# Patient Record
Sex: Male | Born: 1935 | Race: White | Hispanic: No | Marital: Married
Health system: Southern US, Community
[De-identification: ages and names within clinical notes are randomized; demographics above are authoritative.]

---

## 2003-02-28 ENCOUNTER — Other Ambulatory Visit: Payer: Self-pay

## 2003-05-11 ENCOUNTER — Other Ambulatory Visit: Payer: Self-pay

## 2003-05-29 ENCOUNTER — Other Ambulatory Visit: Payer: Self-pay

## 2004-01-19 ENCOUNTER — Ambulatory Visit (HOSPITAL_COMMUNITY): Admission: RE | Admit: 2004-01-19 | Discharge: 2004-01-20 | Payer: Self-pay | Admitting: Otolaryngology

## 2004-06-28 ENCOUNTER — Ambulatory Visit: Payer: Self-pay | Admitting: Internal Medicine

## 2004-11-17 ENCOUNTER — Ambulatory Visit: Payer: Self-pay | Admitting: Vascular Surgery

## 2004-11-24 ENCOUNTER — Ambulatory Visit: Payer: Self-pay | Admitting: Vascular Surgery

## 2005-08-15 ENCOUNTER — Ambulatory Visit: Payer: Self-pay | Admitting: Gastroenterology

## 2005-10-02 ENCOUNTER — Ambulatory Visit: Payer: Self-pay | Admitting: *Deleted

## 2005-10-11 ENCOUNTER — Observation Stay (HOSPITAL_COMMUNITY): Admission: RE | Admit: 2005-10-11 | Discharge: 2005-10-12 | Payer: Self-pay | Admitting: *Deleted

## 2005-10-17 ENCOUNTER — Ambulatory Visit: Payer: Self-pay | Admitting: *Deleted

## 2006-11-27 ENCOUNTER — Ambulatory Visit: Payer: Self-pay | Admitting: Internal Medicine

## 2007-11-25 ENCOUNTER — Ambulatory Visit: Payer: Self-pay | Admitting: Internal Medicine

## 2008-08-05 ENCOUNTER — Ambulatory Visit: Payer: Self-pay | Admitting: Nephrology

## 2009-08-04 ENCOUNTER — Ambulatory Visit: Payer: Self-pay | Admitting: Nephrology

## 2010-06-11 ENCOUNTER — Observation Stay: Payer: Self-pay | Admitting: Internal Medicine

## 2010-06-30 ENCOUNTER — Ambulatory Visit: Payer: Self-pay | Admitting: Internal Medicine

## 2010-10-01 ENCOUNTER — Ambulatory Visit: Payer: Self-pay | Admitting: Internal Medicine

## 2011-07-12 ENCOUNTER — Other Ambulatory Visit: Payer: Self-pay

## 2011-07-12 LAB — BODY FLUID CELL COUNT WITH DIFFERENTIAL
Basophil: 0 %
Eosinophil: 0 %
Lymphocytes: 0 %
Neutrophils: 99 %
Nucleated Cell Count: 53933 /mm3
Other Cells BF: 0 %
Other Mononuclear Cells: 1 %

## 2011-07-12 LAB — SYNOVIAL FLUID, CRYSTAL

## 2011-07-16 LAB — BODY FLUID CULTURE

## 2011-07-17 ENCOUNTER — Other Ambulatory Visit: Payer: Self-pay

## 2011-07-17 LAB — SYNOVIAL CELL COUNT + DIFF, W/ CRYSTALS
Basophil: 0 %
Eosinophil: 0 %
Lymphocytes: 0 %
Neutrophils: 100 %
Nucleated Cell Count: 29887 /mm3
Other Cells BF: 0 %
Other Mononuclear Cells: 0 %

## 2011-07-18 ENCOUNTER — Inpatient Hospital Stay: Payer: Self-pay | Admitting: Orthopedic Surgery

## 2011-07-18 LAB — URINALYSIS, COMPLETE
Bilirubin,UR: NEGATIVE
Blood: NEGATIVE
Glucose,UR: NEGATIVE mg/dL (ref 0–75)
Leukocyte Esterase: NEGATIVE
Nitrite: NEGATIVE
Ph: 5 (ref 4.5–8.0)
Protein: NEGATIVE
RBC,UR: 1 /HPF (ref 0–5)
Specific Gravity: 1.024 (ref 1.003–1.030)
Squamous Epithelial: NONE SEEN
WBC UR: 1 /HPF (ref 0–5)

## 2011-07-19 LAB — CBC WITH DIFFERENTIAL/PLATELET
Basophil #: 0 10*3/uL (ref 0.0–0.1)
Basophil %: 0.3 %
Eosinophil #: 0 10*3/uL (ref 0.0–0.7)
Eosinophil %: 0.3 %
HCT: 32.8 % — ABNORMAL LOW (ref 40.0–52.0)
HGB: 11 g/dL — ABNORMAL LOW (ref 13.0–18.0)
Lymphocyte #: 0.6 10*3/uL — ABNORMAL LOW (ref 1.0–3.6)
Lymphocyte %: 9.2 %
MCH: 29.6 pg (ref 26.0–34.0)
MCHC: 33.6 g/dL (ref 32.0–36.0)
MCV: 88 fL (ref 80–100)
Monocyte #: 0.2 x10 3/mm (ref 0.2–1.0)
Monocyte %: 3.9 %
Neutrophil #: 5.4 10*3/uL (ref 1.4–6.5)
Neutrophil %: 86.3 %
Platelet: 270 10*3/uL (ref 150–440)
RBC: 3.71 10*6/uL — ABNORMAL LOW (ref 4.40–5.90)
RDW: 13.2 % (ref 11.5–14.5)
WBC: 6.2 10*3/uL (ref 3.8–10.6)

## 2011-07-19 LAB — BASIC METABOLIC PANEL
Anion Gap: 11 (ref 7–16)
BUN: 57 mg/dL — ABNORMAL HIGH (ref 7–18)
Calcium, Total: 8.7 mg/dL (ref 8.5–10.1)
Chloride: 104 mmol/L (ref 98–107)
Co2: 24 mmol/L (ref 21–32)
Creatinine: 2.97 mg/dL — ABNORMAL HIGH (ref 0.60–1.30)
EGFR (African American): 21 — ABNORMAL LOW
EGFR (Non-African Amer.): 18 — ABNORMAL LOW
Glucose: 85 mg/dL (ref 65–99)
Osmolality: 293 (ref 275–301)
Potassium: 3.1 mmol/L — ABNORMAL LOW (ref 3.5–5.1)
Sodium: 139 mmol/L (ref 136–145)

## 2011-07-20 LAB — BASIC METABOLIC PANEL
Anion Gap: 10 (ref 7–16)
BUN: 46 mg/dL — ABNORMAL HIGH (ref 7–18)
Calcium, Total: 8.2 mg/dL — ABNORMAL LOW (ref 8.5–10.1)
Chloride: 107 mmol/L (ref 98–107)
Co2: 25 mmol/L (ref 21–32)
Creatinine: 2.87 mg/dL — ABNORMAL HIGH (ref 0.60–1.30)
EGFR (African American): 22 — ABNORMAL LOW
EGFR (Non-African Amer.): 19 — ABNORMAL LOW
Glucose: 83 mg/dL (ref 65–99)
Osmolality: 294 (ref 275–301)
Potassium: 3.4 mmol/L — ABNORMAL LOW (ref 3.5–5.1)
Sodium: 142 mmol/L (ref 136–145)

## 2011-07-20 LAB — CBC WITH DIFFERENTIAL/PLATELET
Basophil #: 0 10*3/uL (ref 0.0–0.1)
Basophil %: 0.3 %
Eosinophil #: 0 10*3/uL (ref 0.0–0.7)
Eosinophil %: 0.7 %
HCT: 30.2 % — ABNORMAL LOW (ref 40.0–52.0)
HGB: 10.1 g/dL — ABNORMAL LOW (ref 13.0–18.0)
Lymphocyte #: 0.9 10*3/uL — ABNORMAL LOW (ref 1.0–3.6)
Lymphocyte %: 14.1 %
MCH: 29.6 pg (ref 26.0–34.0)
MCHC: 33.4 g/dL (ref 32.0–36.0)
MCV: 89 fL (ref 80–100)
Monocyte #: 0.6 x10 3/mm (ref 0.2–1.0)
Monocyte %: 9.1 %
Neutrophil #: 4.9 10*3/uL (ref 1.4–6.5)
Neutrophil %: 75.8 %
Platelet: 253 10*3/uL (ref 150–440)
RBC: 3.41 10*6/uL — ABNORMAL LOW (ref 4.40–5.90)
RDW: 12.8 % (ref 11.5–14.5)
WBC: 6.4 10*3/uL (ref 3.8–10.6)

## 2011-07-21 ENCOUNTER — Encounter: Payer: Self-pay | Admitting: Internal Medicine

## 2011-07-21 LAB — CBC WITH DIFFERENTIAL/PLATELET
Basophil #: 0 10*3/uL (ref 0.0–0.1)
Basophil %: 0.3 %
Eosinophil #: 0.1 10*3/uL (ref 0.0–0.7)
Eosinophil %: 1.2 %
HCT: 28.5 % — ABNORMAL LOW (ref 40.0–52.0)
HGB: 9.6 g/dL — ABNORMAL LOW (ref 13.0–18.0)
Lymphocyte #: 1.2 10*3/uL (ref 1.0–3.6)
Lymphocyte %: 16.5 %
MCH: 30.2 pg (ref 26.0–34.0)
MCHC: 33.7 g/dL (ref 32.0–36.0)
MCV: 89 fL (ref 80–100)
Monocyte #: 0.7 x10 3/mm (ref 0.2–1.0)
Monocyte %: 9.7 %
Neutrophil #: 5.4 10*3/uL (ref 1.4–6.5)
Neutrophil %: 72.3 %
Platelet: 244 10*3/uL (ref 150–440)
RBC: 3.18 10*6/uL — ABNORMAL LOW (ref 4.40–5.90)
RDW: 13.1 % (ref 11.5–14.5)
WBC: 7.5 10*3/uL (ref 3.8–10.6)

## 2011-07-21 LAB — BASIC METABOLIC PANEL
Anion Gap: 11 (ref 7–16)
BUN: 40 mg/dL — ABNORMAL HIGH (ref 7–18)
Calcium, Total: 8 mg/dL — ABNORMAL LOW (ref 8.5–10.1)
Chloride: 108 mmol/L — ABNORMAL HIGH (ref 98–107)
Co2: 22 mmol/L (ref 21–32)
Creatinine: 2.71 mg/dL — ABNORMAL HIGH (ref 0.60–1.30)
EGFR (African American): 23 — ABNORMAL LOW
EGFR (Non-African Amer.): 20 — ABNORMAL LOW
Glucose: 85 mg/dL (ref 65–99)
Osmolality: 290 (ref 275–301)
Potassium: 4.1 mmol/L (ref 3.5–5.1)
Sodium: 141 mmol/L (ref 136–145)

## 2011-07-21 LAB — SYNOVIAL CELL COUNT + DIFF, W/ CRYSTALS
Basophil: 0 %
Eosinophil: 0 %
Lymphocytes: 2 %
Neutrophils: 97 %
Nucleated Cell Count: 3464 /mm3
Other Cells BF: 0 %
Other Mononuclear Cells: 1 %

## 2011-07-21 LAB — BODY FLUID CULTURE

## 2011-07-24 LAB — CULTURE, BLOOD (SINGLE)

## 2011-07-26 LAB — BODY FLUID CULTURE

## 2011-07-27 LAB — BASIC METABOLIC PANEL
Anion Gap: 7 (ref 7–16)
BUN: 38 mg/dL — ABNORMAL HIGH (ref 7–18)
Calcium, Total: 8.8 mg/dL (ref 8.5–10.1)
Chloride: 93 mmol/L — ABNORMAL LOW (ref 98–107)
Co2: 28 mmol/L (ref 21–32)
Creatinine: 2.7 mg/dL — ABNORMAL HIGH (ref 0.60–1.30)
EGFR (African American): 23 — ABNORMAL LOW
EGFR (Non-African Amer.): 20 — ABNORMAL LOW
Glucose: 90 mg/dL (ref 65–99)
Osmolality: 266 (ref 275–301)
Potassium: 4.9 mmol/L (ref 3.5–5.1)
Sodium: 128 mmol/L — ABNORMAL LOW (ref 136–145)

## 2011-08-01 LAB — BASIC METABOLIC PANEL
Anion Gap: 10 (ref 7–16)
BUN: 39 mg/dL — ABNORMAL HIGH (ref 7–18)
Calcium, Total: 7.9 mg/dL — ABNORMAL LOW (ref 8.5–10.1)
Chloride: 85 mmol/L — ABNORMAL LOW (ref 98–107)
Co2: 26 mmol/L (ref 21–32)
Creatinine: 2.21 mg/dL — ABNORMAL HIGH (ref 0.60–1.30)
EGFR (African American): 30 — ABNORMAL LOW
EGFR (Non-African Amer.): 26 — ABNORMAL LOW
Glucose: 70 mg/dL (ref 65–99)
Osmolality: 252 (ref 275–301)
Potassium: 3.6 mmol/L (ref 3.5–5.1)
Sodium: 121 mmol/L — ABNORMAL LOW (ref 136–145)

## 2011-08-07 LAB — URINALYSIS, COMPLETE
Bacteria: NONE SEEN
Bilirubin,UR: NEGATIVE
Blood: NEGATIVE
Glucose,UR: NEGATIVE mg/dL (ref 0–75)
Leukocyte Esterase: NEGATIVE
Nitrite: NEGATIVE
Ph: 6 (ref 4.5–8.0)
Protein: 100
RBC,UR: 2 /HPF (ref 0–5)
Specific Gravity: 1.019 (ref 1.003–1.030)
Squamous Epithelial: <1
WBC UR: 1 /HPF (ref 0–5)

## 2011-08-08 ENCOUNTER — Inpatient Hospital Stay: Payer: Self-pay | Admitting: Internal Medicine

## 2011-08-08 LAB — CBC
HCT: 32.3 % — ABNORMAL LOW (ref 40.0–52.0)
HGB: 11.2 g/dL — ABNORMAL LOW (ref 13.0–18.0)
MCH: 29.2 pg (ref 26.0–34.0)
MCHC: 34.6 g/dL (ref 32.0–36.0)
MCV: 84 fL (ref 80–100)
Platelet: 276 10*3/uL (ref 150–440)
RBC: 3.84 10*6/uL — ABNORMAL LOW (ref 4.40–5.90)
RDW: 13.2 % (ref 11.5–14.5)
WBC: 10.9 10*3/uL — ABNORMAL HIGH (ref 3.8–10.6)

## 2011-08-08 LAB — OSMOLALITY, URINE: Osmolality: 538 mOsm/kg

## 2011-08-08 LAB — BASIC METABOLIC PANEL
Anion Gap: 9 (ref 7–16)
BUN: 32 mg/dL — ABNORMAL HIGH (ref 7–18)
Calcium, Total: 8.1 mg/dL — ABNORMAL LOW (ref 8.5–10.1)
Chloride: 82 mmol/L — ABNORMAL LOW (ref 98–107)
Co2: 25 mmol/L (ref 21–32)
Creatinine: 1.99 mg/dL — ABNORMAL HIGH (ref 0.60–1.30)
EGFR (African American): 34 — ABNORMAL LOW
EGFR (Non-African Amer.): 29 — ABNORMAL LOW
Glucose: 77 mg/dL (ref 65–99)
Osmolality: 240 (ref 275–301)
Potassium: 3.4 mmol/L — ABNORMAL LOW (ref 3.5–5.1)
Sodium: 116 mmol/L — CL (ref 136–145)

## 2011-08-08 LAB — URINALYSIS, COMPLETE
Bacteria: NONE SEEN
Bilirubin,UR: NEGATIVE
Blood: NEGATIVE
Glucose,UR: NEGATIVE mg/dL (ref 0–75)
Ketone: NEGATIVE
Leukocyte Esterase: NEGATIVE
Nitrite: NEGATIVE
Ph: 7 (ref 4.5–8.0)
Protein: 30
RBC,UR: 13 /HPF (ref 0–5)
Specific Gravity: 1.016 (ref 1.003–1.030)
Squamous Epithelial: NONE SEEN
WBC UR: <1 /HPF (ref 0–5)

## 2011-08-08 LAB — OSMOLALITY: Osmolality: 247 mOsm/kg — CL (ref 280–301)

## 2011-08-08 LAB — TROPONIN I: Troponin-I: 0.08 ng/mL — ABNORMAL HIGH

## 2011-08-08 LAB — URIC ACID: Uric Acid: 5.8 mg/dL (ref 3.5–7.2)

## 2011-08-09 LAB — CK TOTAL AND CKMB (NOT AT ARMC)
CK, Total: 70 U/L (ref 35–232)
CK, Total: 74 U/L (ref 35–232)
CK-MB: 2.7 ng/mL (ref 0.5–3.6)
CK-MB: 2.4 ng/mL (ref 0.5–3.6)

## 2011-08-09 LAB — BASIC METABOLIC PANEL
Anion Gap: 8 (ref 7–16)
BUN: 31 mg/dL — ABNORMAL HIGH (ref 7–18)
Calcium, Total: 7.2 mg/dL — ABNORMAL LOW (ref 8.5–10.1)
Chloride: 88 mmol/L — ABNORMAL LOW (ref 98–107)
Co2: 25 mmol/L (ref 21–32)
Creatinine: 1.93 mg/dL — ABNORMAL HIGH (ref 0.60–1.30)
EGFR (African American): 35 — ABNORMAL LOW
EGFR (Non-African Amer.): 30 — ABNORMAL LOW
Glucose: 77 mg/dL (ref 65–99)
Osmolality: 249 (ref 275–301)
Potassium: 3.6 mmol/L (ref 3.5–5.1)
Sodium: 121 mmol/L — ABNORMAL LOW (ref 136–145)

## 2011-08-09 LAB — CBC WITH DIFFERENTIAL/PLATELET
Basophil #: 0 10*3/uL (ref 0.0–0.1)
Basophil %: 0 %
Eosinophil #: 0 10*3/uL (ref 0.0–0.7)
Eosinophil %: 0 %
HCT: 29.4 % — ABNORMAL LOW (ref 40.0–52.0)
HGB: 10 g/dL — ABNORMAL LOW (ref 13.0–18.0)
Lymphocyte #: 0.9 10*3/uL — ABNORMAL LOW (ref 1.0–3.6)
Lymphocyte %: 10.7 %
MCH: 28.9 pg (ref 26.0–34.0)
MCHC: 34.1 g/dL (ref 32.0–36.0)
MCV: 85 fL (ref 80–100)
Monocyte #: 0.8 x10 3/mm (ref 0.2–1.0)
Monocyte %: 9.2 %
Neutrophil #: 6.6 10*3/uL — ABNORMAL HIGH (ref 1.4–6.5)
Neutrophil %: 80.1 %
Platelet: 204 10*3/uL (ref 150–440)
RBC: 3.46 10*6/uL — ABNORMAL LOW (ref 4.40–5.90)
RDW: 12.9 % (ref 11.5–14.5)
WBC: 8.3 10*3/uL (ref 3.8–10.6)

## 2011-08-09 LAB — TROPONIN I
Troponin-I: 0.08 ng/mL — ABNORMAL HIGH
Troponin-I: 0.07 ng/mL — ABNORMAL HIGH

## 2011-08-10 LAB — SYNOVIAL CELL COUNT + DIFF, W/ CRYSTALS
Basophil: 0 %
Eosinophil: 0 %
Lymphocytes: 0 %
Neutrophils: 95 %
Nucleated Cell Count: 30974 /mm3
Other Cells BF: 0 %
Other Mononuclear Cells: 5 %

## 2011-08-10 LAB — BASIC METABOLIC PANEL
Anion Gap: 11 (ref 7–16)
BUN: 23 mg/dL — ABNORMAL HIGH (ref 7–18)
Calcium, Total: 7 mg/dL — CL (ref 8.5–10.1)
Chloride: 92 mmol/L — ABNORMAL LOW (ref 98–107)
Co2: 16 mmol/L — ABNORMAL LOW (ref 21–32)
Creatinine: 1.59 mg/dL — ABNORMAL HIGH (ref 0.60–1.30)
EGFR (African American): 44 — ABNORMAL LOW
EGFR (Non-African Amer.): 38 — ABNORMAL LOW
Glucose: 72 mg/dL (ref 65–99)
Osmolality: 243 (ref 275–301)
Potassium: 3.6 mmol/L (ref 3.5–5.1)
Sodium: 119 mmol/L — CL (ref 136–145)

## 2011-08-11 LAB — BASIC METABOLIC PANEL
Anion Gap: 10 (ref 7–16)
BUN: 21 mg/dL — ABNORMAL HIGH (ref 7–18)
Calcium, Total: 7.3 mg/dL — ABNORMAL LOW (ref 8.5–10.1)
Chloride: 91 mmol/L — ABNORMAL LOW (ref 98–107)
Co2: 20 mmol/L — ABNORMAL LOW (ref 21–32)
Creatinine: 1.82 mg/dL — ABNORMAL HIGH (ref 0.60–1.30)
EGFR (African American): 37 — ABNORMAL LOW
EGFR (Non-African Amer.): 32 — ABNORMAL LOW
Glucose: 65 mg/dL (ref 65–99)
Osmolality: 245 (ref 275–301)
Potassium: 3.6 mmol/L (ref 3.5–5.1)
Sodium: 121 mmol/L — ABNORMAL LOW (ref 136–145)

## 2011-08-11 LAB — SODIUM, URINE, TIMED
Collection Hours: 24 hours
Sodium, 24 Hour Urine: 203 mmol/24HR (ref 40–220)

## 2011-08-11 LAB — CHLORIDE, URINE, TIMED: Chloride, 24 Hour Urine: 227 mmol/24HR (ref 110–250)

## 2011-08-12 LAB — URIC ACID: Uric Acid: 5.2 mg/dL (ref 3.5–7.2)

## 2011-08-12 LAB — BASIC METABOLIC PANEL
Anion Gap: 13 (ref 7–16)
Co2: 19 mmol/L — ABNORMAL LOW (ref 21–32)
Creatinine: 1.8 mg/dL — ABNORMAL HIGH (ref 0.60–1.30)
Glucose: 72 mg/dL (ref 65–99)
Sodium: 124 mmol/L — ABNORMAL LOW (ref 136–145)

## 2011-08-13 LAB — BASIC METABOLIC PANEL
Anion Gap: 9 (ref 7–16)
BUN: 19 mg/dL — ABNORMAL HIGH (ref 7–18)
EGFR (African American): 30 — ABNORMAL LOW
EGFR (Non-African Amer.): 26 — ABNORMAL LOW
Glucose: 72 mg/dL (ref 65–99)
Potassium: 3.6 mmol/L (ref 3.5–5.1)
Sodium: 129 mmol/L — ABNORMAL LOW (ref 136–145)

## 2011-08-13 LAB — PLATELET COUNT: Platelet: 170 10*3/uL (ref 150–440)

## 2011-08-13 LAB — MAGNESIUM: Magnesium: 1 mg/dL — ABNORMAL LOW

## 2011-08-14 LAB — BASIC METABOLIC PANEL
Anion Gap: 8 (ref 7–16)
BUN: 17 mg/dL (ref 7–18)
Calcium, Total: 7.6 mg/dL — ABNORMAL LOW (ref 8.5–10.1)
Chloride: 103 mmol/L (ref 98–107)
Co2: 23 mmol/L (ref 21–32)
Creatinine: 2.08 mg/dL — ABNORMAL HIGH (ref 0.60–1.30)
EGFR (African American): 32 — ABNORMAL LOW
EGFR (Non-African Amer.): 27 — ABNORMAL LOW
Glucose: 79 mg/dL (ref 65–99)
Osmolality: 269 (ref 275–301)
Potassium: 3.6 mmol/L (ref 3.5–5.1)
Sodium: 134 mmol/L — ABNORMAL LOW (ref 136–145)

## 2011-08-14 LAB — MAGNESIUM: Magnesium: 1.4 mg/dL — ABNORMAL LOW

## 2011-08-14 LAB — BODY FLUID CULTURE

## 2011-08-15 LAB — CBC WITH DIFFERENTIAL/PLATELET
Basophil #: 0 10*3/uL (ref 0.0–0.1)
Basophil %: 0.8 %
Eosinophil #: 0.1 10*3/uL (ref 0.0–0.7)
Eosinophil %: 1.8 %
HCT: 29.7 % — ABNORMAL LOW (ref 40.0–52.0)
HGB: 10 g/dL — ABNORMAL LOW (ref 13.0–18.0)
Lymphocyte #: 0.9 10*3/uL — ABNORMAL LOW (ref 1.0–3.6)
Lymphocyte %: 21.9 %
MCH: 29.2 pg (ref 26.0–34.0)
MCHC: 33.6 g/dL (ref 32.0–36.0)
MCV: 87 fL (ref 80–100)
Monocyte #: 0.4 x10 3/mm (ref 0.2–1.0)
Monocyte %: 10.7 %
Neutrophil #: 2.6 10*3/uL (ref 1.4–6.5)
Neutrophil %: 64.8 %
Platelet: 136 10*3/uL — ABNORMAL LOW (ref 150–440)
RBC: 3.4 10*6/uL — ABNORMAL LOW (ref 4.40–5.90)
RDW: 13.9 % (ref 11.5–14.5)
WBC: 4 10*3/uL (ref 3.8–10.6)

## 2011-08-15 LAB — MAGNESIUM: Magnesium: 1.9 mg/dL

## 2011-08-15 LAB — BASIC METABOLIC PANEL
Anion Gap: 8 (ref 7–16)
BUN: 17 mg/dL (ref 7–18)
Calcium, Total: 7.5 mg/dL — ABNORMAL LOW (ref 8.5–10.1)
Chloride: 104 mmol/L (ref 98–107)
Co2: 23 mmol/L (ref 21–32)
Creatinine: 1.94 mg/dL — ABNORMAL HIGH (ref 0.60–1.30)
EGFR (African American): 35 — ABNORMAL LOW
EGFR (Non-African Amer.): 30 — ABNORMAL LOW
Glucose: 89 mg/dL (ref 65–99)
Osmolality: 271 (ref 275–301)
Potassium: 3.6 mmol/L (ref 3.5–5.1)
Sodium: 135 mmol/L — ABNORMAL LOW (ref 136–145)

## 2011-08-16 ENCOUNTER — Encounter: Payer: Self-pay | Admitting: Internal Medicine

## 2011-08-22 LAB — CBC WITH DIFFERENTIAL/PLATELET
Basophil #: 0 10*3/uL (ref 0.0–0.1)
Basophil %: 0.5 %
Eosinophil #: 0.1 10*3/uL (ref 0.0–0.7)
Eosinophil %: 1.1 %
HCT: 29.4 % — ABNORMAL LOW (ref 40.0–52.0)
HGB: 9.7 g/dL — ABNORMAL LOW (ref 13.0–18.0)
Lymphocyte #: 0.9 10*3/uL — ABNORMAL LOW (ref 1.0–3.6)
Lymphocyte %: 18.1 %
MCH: 29.2 pg (ref 26.0–34.0)
MCHC: 32.9 g/dL (ref 32.0–36.0)
MCV: 89 fL (ref 80–100)
Monocyte #: 0.5 x10 3/mm (ref 0.2–1.0)
Monocyte %: 8.8 %
Neutrophil #: 3.8 10*3/uL (ref 1.4–6.5)
Neutrophil %: 71.5 %
Platelet: 265 10*3/uL (ref 150–440)
RBC: 3.3 10*6/uL — ABNORMAL LOW (ref 4.40–5.90)
RDW: 14 % (ref 11.5–14.5)
WBC: 5.2 10*3/uL (ref 3.8–10.6)

## 2011-08-22 LAB — BASIC METABOLIC PANEL
Anion Gap: 8 (ref 7–16)
BUN: 23 mg/dL — ABNORMAL HIGH (ref 7–18)
Calcium, Total: 8.4 mg/dL — ABNORMAL LOW (ref 8.5–10.1)
Chloride: 103 mmol/L (ref 98–107)
Co2: 24 mmol/L (ref 21–32)
Creatinine: 2.14 mg/dL — ABNORMAL HIGH (ref 0.60–1.30)
EGFR (African American): 31 — ABNORMAL LOW
EGFR (Non-African Amer.): 26 — ABNORMAL LOW
Glucose: 84 mg/dL (ref 65–99)
Osmolality: 273 (ref 275–301)
Potassium: 5.8 mmol/L — ABNORMAL HIGH (ref 3.5–5.1)
Sodium: 135 mmol/L — ABNORMAL LOW (ref 136–145)

## 2011-08-22 LAB — MAGNESIUM: Magnesium: 1.3 mg/dL — ABNORMAL LOW

## 2011-08-28 ENCOUNTER — Ambulatory Visit: Payer: Self-pay | Admitting: Orthopedic Surgery

## 2011-08-28 LAB — CBC WITH DIFFERENTIAL/PLATELET
Basophil #: 0 10*3/uL (ref 0.0–0.1)
Basophil %: 0.3 %
Eosinophil #: 0 10*3/uL (ref 0.0–0.7)
Eosinophil %: 0.4 %
HCT: 29.5 % — ABNORMAL LOW (ref 40.0–52.0)
HGB: 9.7 g/dL — ABNORMAL LOW (ref 13.0–18.0)
Lymphocyte #: 1.2 10*3/uL (ref 1.0–3.6)
Lymphocyte %: 13.8 %
MCH: 29.1 pg (ref 26.0–34.0)
MCHC: 32.9 g/dL (ref 32.0–36.0)
MCV: 88 fL (ref 80–100)
Monocyte #: 0.7 x10 3/mm (ref 0.2–1.0)
Monocyte %: 8.6 %
Neutrophil #: 6.6 10*3/uL — ABNORMAL HIGH (ref 1.4–6.5)
Neutrophil %: 76.9 %
Platelet: 353 10*3/uL (ref 150–440)
RBC: 3.33 10*6/uL — ABNORMAL LOW (ref 4.40–5.90)
RDW: 14.4 % (ref 11.5–14.5)
WBC: 8.6 10*3/uL (ref 3.8–10.6)

## 2011-08-28 LAB — BASIC METABOLIC PANEL
Anion Gap: 8 (ref 7–16)
BUN: 24 mg/dL — ABNORMAL HIGH (ref 7–18)
Calcium, Total: 8.4 mg/dL — ABNORMAL LOW (ref 8.5–10.1)
Chloride: 101 mmol/L (ref 98–107)
Co2: 25 mmol/L (ref 21–32)
Creatinine: 2.43 mg/dL — ABNORMAL HIGH (ref 0.60–1.30)
EGFR (African American): 26 — ABNORMAL LOW
EGFR (Non-African Amer.): 23 — ABNORMAL LOW
Glucose: 87 mg/dL (ref 65–99)
Osmolality: 272 (ref 275–301)
Potassium: 4.2 mmol/L (ref 3.5–5.1)
Sodium: 134 mmol/L — ABNORMAL LOW (ref 136–145)

## 2011-08-28 LAB — MAGNESIUM: Magnesium: 1.6 mg/dL — ABNORMAL LOW

## 2011-08-31 ENCOUNTER — Ambulatory Visit: Payer: Self-pay | Admitting: Orthopedic Surgery

## 2011-09-05 ENCOUNTER — Emergency Department: Payer: Self-pay | Admitting: Emergency Medicine

## 2011-09-25 ENCOUNTER — Ambulatory Visit: Payer: Self-pay | Admitting: Orthopedic Surgery

## 2012-03-19 DIAGNOSIS — I2510 Atherosclerotic heart disease of native coronary artery without angina pectoris: Secondary | ICD-10-CM

## 2012-03-19 DIAGNOSIS — I609 Nontraumatic subarachnoid hemorrhage, unspecified: Secondary | ICD-10-CM

## 2012-03-19 DIAGNOSIS — S02109A Fracture of base of skull, unspecified side, initial encounter for closed fracture: Secondary | ICD-10-CM

## 2012-03-19 DIAGNOSIS — I6200 Nontraumatic subdural hemorrhage, unspecified: Secondary | ICD-10-CM

## 2012-04-12 ENCOUNTER — Encounter: Payer: Self-pay | Admitting: Nurse Practitioner

## 2012-04-12 ENCOUNTER — Inpatient Hospital Stay: Payer: Self-pay | Admitting: Internal Medicine

## 2012-04-12 ENCOUNTER — Encounter: Payer: Self-pay | Admitting: Cardiothoracic Surgery

## 2012-04-12 LAB — BASIC METABOLIC PANEL
Anion Gap: 6 — ABNORMAL LOW (ref 7–16)
Calcium, Total: 8.8 mg/dL (ref 8.5–10.1)
Chloride: 110 mmol/L — ABNORMAL HIGH (ref 98–107)
Co2: 23 mmol/L (ref 21–32)
EGFR (Non-African Amer.): 18 — ABNORMAL LOW
Glucose: 113 mg/dL — ABNORMAL HIGH (ref 65–99)
Osmolality: 288 (ref 275–301)
Potassium: 5.2 mmol/L — ABNORMAL HIGH (ref 3.5–5.1)
Sodium: 139 mmol/L (ref 136–145)

## 2012-04-12 LAB — CBC WITH DIFFERENTIAL/PLATELET
Basophil #: 0 10*3/uL (ref 0.0–0.1)
Basophil %: 0.2 %
Eosinophil %: 0.7 %
HCT: 30.9 % — ABNORMAL LOW (ref 40.0–52.0)
HGB: 9.7 g/dL — ABNORMAL LOW (ref 13.0–18.0)
Lymphocyte #: 1.4 10*3/uL (ref 1.0–3.6)
Lymphocyte %: 10.4 %
Monocyte #: 0.8 x10 3/mm (ref 0.2–1.0)
Monocyte %: 6.1 %
Neutrophil %: 82.6 %
Platelet: 293 10*3/uL (ref 150–440)
WBC: 13.3 10*3/uL — ABNORMAL HIGH (ref 3.8–10.6)

## 2012-04-12 LAB — URINALYSIS, COMPLETE
Bilirubin,UR: NEGATIVE
Blood: NEGATIVE
Leukocyte Esterase: NEGATIVE
Nitrite: NEGATIVE
Protein: NEGATIVE
Specific Gravity: 1.013 (ref 1.003–1.030)

## 2012-04-12 LAB — PROTIME-INR
INR: 1
Prothrombin Time: 13.7 secs (ref 11.5–14.7)

## 2012-04-13 ENCOUNTER — Encounter: Payer: Self-pay | Admitting: Nurse Practitioner

## 2012-04-13 ENCOUNTER — Ambulatory Visit: Payer: Self-pay | Admitting: Internal Medicine

## 2012-04-13 LAB — CBC WITH DIFFERENTIAL/PLATELET
Eosinophil #: 0.1 10*3/uL (ref 0.0–0.7)
Eosinophil %: 1.3 %
HCT: 28.8 % — ABNORMAL LOW (ref 40.0–52.0)
HGB: 9.4 g/dL — ABNORMAL LOW (ref 13.0–18.0)
Lymphocyte %: 8 %
MCH: 29.2 pg (ref 26.0–34.0)
MCHC: 32.8 g/dL (ref 32.0–36.0)
MCV: 89 fL (ref 80–100)
Monocyte #: 0.7 x10 3/mm (ref 0.2–1.0)
Neutrophil #: 9.7 10*3/uL — ABNORMAL HIGH (ref 1.4–6.5)
Platelet: 229 10*3/uL (ref 150–440)
RDW: 14.1 % (ref 11.5–14.5)
WBC: 11.4 10*3/uL — ABNORMAL HIGH (ref 3.8–10.6)

## 2012-04-13 LAB — BASIC METABOLIC PANEL
Anion Gap: 7 (ref 7–16)
BUN: 36 mg/dL — ABNORMAL HIGH (ref 7–18)
Chloride: 111 mmol/L — ABNORMAL HIGH (ref 98–107)
Co2: 24 mmol/L (ref 21–32)
EGFR (African American): 24 — ABNORMAL LOW
Osmolality: 290 (ref 275–301)
Potassium: 4.6 mmol/L (ref 3.5–5.1)
Sodium: 142 mmol/L (ref 136–145)

## 2012-04-14 LAB — CBC WITH DIFFERENTIAL/PLATELET
Basophil #: 0.1 10*3/uL (ref 0.0–0.1)
Eosinophil #: 0.1 10*3/uL (ref 0.0–0.7)
HCT: 35 % — ABNORMAL LOW (ref 40.0–52.0)
HGB: 8.2 g/dL — ABNORMAL LOW (ref 13.0–18.0)
Lymphocyte #: 1.1 10*3/uL (ref 1.0–3.6)
Lymphocyte %: 8.8 %
Lymphocyte %: 8.2 %
MCH: 30 pg (ref 26.0–34.0)
MCH: 28.6 pg (ref 26.0–34.0)
MCHC: 33.7 g/dL (ref 32.0–36.0)
MCV: 89 fL (ref 80–100)
MCV: 88 fL (ref 80–100)
Monocyte #: 0.8 x10 3/mm (ref 0.2–1.0)
Monocyte %: 5.9 %
Neutrophil #: 9.2 10*3/uL — ABNORMAL HIGH (ref 1.4–6.5)
Neutrophil #: 11 10*3/uL — ABNORMAL HIGH (ref 1.4–6.5)
Neutrophil %: 84.4 %
Neutrophil %: 84.9 %
RDW: 15.7 % — ABNORMAL HIGH (ref 11.5–14.5)
WBC: 10.9 10*3/uL — ABNORMAL HIGH (ref 3.8–10.6)
WBC: 13 10*3/uL — ABNORMAL HIGH (ref 3.8–10.6)

## 2012-04-14 LAB — BASIC METABOLIC PANEL
Anion Gap: 5 — ABNORMAL LOW (ref 7–16)
BUN: 34 mg/dL — ABNORMAL HIGH (ref 7–18)
Calcium, Total: 8.2 mg/dL — ABNORMAL LOW (ref 8.5–10.1)
Chloride: 110 mmol/L — ABNORMAL HIGH (ref 98–107)
Co2: 25 mmol/L (ref 21–32)
Sodium: 140 mmol/L (ref 136–145)

## 2012-04-15 LAB — URINALYSIS, COMPLETE
Bilirubin,UR: NEGATIVE
Glucose,UR: NEGATIVE mg/dL (ref 0–75)
Ketone: NEGATIVE
Leukocyte Esterase: NEGATIVE
Nitrite: NEGATIVE
Protein: NEGATIVE
Specific Gravity: 1.013 (ref 1.003–1.030)
WBC UR: 1 /HPF (ref 0–5)

## 2012-04-15 LAB — CBC WITH DIFFERENTIAL/PLATELET
Basophil #: 0 10*3/uL (ref 0.0–0.1)
Eosinophil #: 0 10*3/uL (ref 0.0–0.7)
Eosinophil %: 0.2 %
HGB: 10 g/dL — ABNORMAL LOW (ref 13.0–18.0)
Lymphocyte #: 0.8 10*3/uL — ABNORMAL LOW (ref 1.0–3.6)
Lymphocyte %: 7.3 %
MCH: 29.1 pg (ref 26.0–34.0)
MCHC: 33 g/dL (ref 32.0–36.0)
MCV: 88 fL (ref 80–100)
Monocyte #: 0.8 x10 3/mm (ref 0.2–1.0)
Neutrophil #: 9 10*3/uL — ABNORMAL HIGH (ref 1.4–6.5)
Platelet: 181 10*3/uL (ref 150–440)
RBC: 3.43 10*6/uL — ABNORMAL LOW (ref 4.40–5.90)
RDW: 15.1 % — ABNORMAL HIGH (ref 11.5–14.5)

## 2012-04-15 LAB — BASIC METABOLIC PANEL
Anion Gap: 6 — ABNORMAL LOW (ref 7–16)
BUN: 32 mg/dL — ABNORMAL HIGH (ref 7–18)
Chloride: 108 mmol/L — ABNORMAL HIGH (ref 98–107)
Co2: 24 mmol/L (ref 21–32)
EGFR (African American): 28 — ABNORMAL LOW
EGFR (Non-African Amer.): 24 — ABNORMAL LOW
Osmolality: 282 (ref 275–301)

## 2012-04-16 LAB — CBC WITH DIFFERENTIAL/PLATELET
Basophil %: 0.2 %
Eosinophil %: 1 %
HCT: 27.4 % — ABNORMAL LOW (ref 40.0–52.0)
HGB: 9.5 g/dL — ABNORMAL LOW (ref 13.0–18.0)
MCHC: 34.8 g/dL (ref 32.0–36.0)
Monocyte #: 0.8 x10 3/mm (ref 0.2–1.0)
Monocyte %: 7.8 %
Neutrophil #: 8.3 10*3/uL — ABNORMAL HIGH (ref 1.4–6.5)
Neutrophil %: 81.1 %
Platelet: 175 10*3/uL (ref 150–440)
RDW: 15.5 % — ABNORMAL HIGH (ref 11.5–14.5)
WBC: 10.2 10*3/uL (ref 3.8–10.6)

## 2012-04-16 LAB — BASIC METABOLIC PANEL
Anion Gap: 5 — ABNORMAL LOW (ref 7–16)
Chloride: 110 mmol/L — ABNORMAL HIGH (ref 98–107)
Creatinine: 2.49 mg/dL — ABNORMAL HIGH (ref 0.60–1.30)
Glucose: 94 mg/dL (ref 65–99)

## 2012-04-17 LAB — BASIC METABOLIC PANEL
Anion Gap: 6 — ABNORMAL LOW (ref 7–16)
BUN: 38 mg/dL — ABNORMAL HIGH (ref 7–18)
Chloride: 112 mmol/L — ABNORMAL HIGH (ref 98–107)
Creatinine: 2.49 mg/dL — ABNORMAL HIGH (ref 0.60–1.30)
EGFR (African American): 26 — ABNORMAL LOW
EGFR (Non-African Amer.): 22 — ABNORMAL LOW
Glucose: 89 mg/dL (ref 65–99)
Osmolality: 288 (ref 275–301)
Potassium: 4.3 mmol/L (ref 3.5–5.1)
Sodium: 140 mmol/L (ref 136–145)

## 2012-04-17 LAB — CBC WITH DIFFERENTIAL/PLATELET
Basophil #: 0 10*3/uL (ref 0.0–0.1)
Eosinophil #: 0.1 10*3/uL (ref 0.0–0.7)
HCT: 26.2 % — ABNORMAL LOW (ref 40.0–52.0)
HGB: 8.7 g/dL — ABNORMAL LOW (ref 13.0–18.0)
MCH: 29.1 pg (ref 26.0–34.0)
MCV: 88 fL (ref 80–100)
Monocyte #: 0.7 x10 3/mm (ref 0.2–1.0)
Monocyte %: 7 %
Neutrophil #: 8.2 10*3/uL — ABNORMAL HIGH (ref 1.4–6.5)
Neutrophil %: 80.7 %
Platelet: 194 10*3/uL (ref 150–440)
RBC: 2.98 10*6/uL — ABNORMAL LOW (ref 4.40–5.90)
RDW: 15.3 % — ABNORMAL HIGH (ref 11.5–14.5)

## 2012-04-17 LAB — IRON AND TIBC
Iron Bind.Cap.(Total): 123 ug/dL — ABNORMAL LOW (ref 250–450)
Iron Saturation: 11 %
Iron: 14 ug/dL — ABNORMAL LOW (ref 65–175)
Unbound Iron-Bind.Cap.: 109 ug/dL

## 2012-04-17 LAB — URINALYSIS, COMPLETE
Bilirubin,UR: NEGATIVE
Ketone: NEGATIVE
Ph: 5 (ref 4.5–8.0)
Protein: NEGATIVE
Specific Gravity: 1.015 (ref 1.003–1.030)

## 2012-04-17 LAB — PATHOLOGY REPORT

## 2012-04-17 LAB — FERRITIN: Ferritin (ARMC): 282 ng/mL (ref 8–388)

## 2012-04-18 LAB — BASIC METABOLIC PANEL
BUN: 38 mg/dL — ABNORMAL HIGH (ref 7–18)
Calcium, Total: 8.1 mg/dL — ABNORMAL LOW (ref 8.5–10.1)
Chloride: 110 mmol/L — ABNORMAL HIGH (ref 98–107)
Co2: 24 mmol/L (ref 21–32)
Creatinine: 2.35 mg/dL — ABNORMAL HIGH (ref 0.60–1.30)
EGFR (Non-African Amer.): 24 — ABNORMAL LOW
Glucose: 94 mg/dL (ref 65–99)
Osmolality: 286 (ref 275–301)
Potassium: 4.8 mmol/L (ref 3.5–5.1)
Sodium: 139 mmol/L (ref 136–145)

## 2012-04-18 LAB — CBC WITH DIFFERENTIAL/PLATELET
Basophil %: 0.2 %
Eosinophil #: 0.1 10*3/uL (ref 0.0–0.7)
Eosinophil %: 1.2 %
HGB: 9 g/dL — ABNORMAL LOW (ref 13.0–18.0)
MCHC: 33.8 g/dL (ref 32.0–36.0)
MCV: 88 fL (ref 80–100)
Monocyte %: 7.6 %
Neutrophil #: 8.2 10*3/uL — ABNORMAL HIGH (ref 1.4–6.5)
Neutrophil %: 80.3 %
RBC: 3.02 10*6/uL — ABNORMAL LOW (ref 4.40–5.90)
RDW: 15.4 % — ABNORMAL HIGH (ref 11.5–14.5)
WBC: 10.2 10*3/uL (ref 3.8–10.6)

## 2012-04-19 ENCOUNTER — Encounter: Payer: Self-pay | Admitting: Internal Medicine

## 2012-04-19 ENCOUNTER — Encounter: Payer: Self-pay | Admitting: Cardiothoracic Surgery

## 2012-04-23 LAB — BASIC METABOLIC PANEL
Anion Gap: 8 (ref 7–16)
BUN: 26 mg/dL — ABNORMAL HIGH (ref 7–18)
Calcium, Total: 8.7 mg/dL (ref 8.5–10.1)
Chloride: 106 mmol/L (ref 98–107)
Creatinine: 2.12 mg/dL — ABNORMAL HIGH (ref 0.60–1.30)
EGFR (African American): 31 — ABNORMAL LOW
Glucose: 80 mg/dL (ref 65–99)
Osmolality: 281 (ref 275–301)
Potassium: 4.1 mmol/L (ref 3.5–5.1)
Sodium: 139 mmol/L (ref 136–145)

## 2012-04-25 LAB — CBC WITH DIFFERENTIAL/PLATELET
Eosinophil #: 0.1 10*3/uL (ref 0.0–0.7)
HCT: 30.5 % — ABNORMAL LOW (ref 40.0–52.0)
Lymphocyte #: 1.1 10*3/uL (ref 1.0–3.6)
Lymphocyte %: 11.2 %
MCH: 28.8 pg (ref 26.0–34.0)
MCHC: 32.4 g/dL (ref 32.0–36.0)
Monocyte #: 0.7 x10 3/mm (ref 0.2–1.0)
Monocyte %: 6.7 %
Neutrophil #: 7.9 10*3/uL — ABNORMAL HIGH (ref 1.4–6.5)
Platelet: 372 10*3/uL (ref 150–440)
RDW: 16.4 % — ABNORMAL HIGH (ref 11.5–14.5)
WBC: 9.9 10*3/uL (ref 3.8–10.6)

## 2012-04-25 LAB — BASIC METABOLIC PANEL
Chloride: 105 mmol/L (ref 98–107)
Creatinine: 2.36 mg/dL — ABNORMAL HIGH (ref 0.60–1.30)
EGFR (African American): 27 — ABNORMAL LOW
EGFR (Non-African Amer.): 24 — ABNORMAL LOW
Glucose: 91 mg/dL (ref 65–99)
Osmolality: 282 (ref 275–301)
Sodium: 138 mmol/L (ref 136–145)

## 2012-05-07 ENCOUNTER — Inpatient Hospital Stay: Payer: Self-pay | Admitting: Internal Medicine

## 2012-05-07 LAB — COMPREHENSIVE METABOLIC PANEL
Alkaline Phosphatase: 191 U/L — ABNORMAL HIGH (ref 50–136)
Anion Gap: 3 — ABNORMAL LOW (ref 7–16)
BUN: 38 mg/dL — ABNORMAL HIGH (ref 7–18)
Calcium, Total: 9.5 mg/dL (ref 8.5–10.1)
Co2: 29 mmol/L (ref 21–32)
Creatinine: 2.63 mg/dL — ABNORMAL HIGH (ref 0.60–1.30)
EGFR (African American): 24 — ABNORMAL LOW
Glucose: 96 mg/dL (ref 65–99)
Osmolality: 279 (ref 275–301)
Potassium: 4.8 mmol/L (ref 3.5–5.1)
SGPT (ALT): 14 U/L (ref 12–78)
Sodium: 135 mmol/L — ABNORMAL LOW (ref 136–145)

## 2012-05-07 LAB — URINALYSIS, COMPLETE
Blood: NEGATIVE
Glucose,UR: NEGATIVE mg/dL (ref 0–75)
Ketone: NEGATIVE
Nitrite: NEGATIVE
Ph: 6 (ref 4.5–8.0)
Protein: NEGATIVE
RBC,UR: 1 /HPF (ref 0–5)
WBC UR: 1 /HPF (ref 0–5)

## 2012-05-07 LAB — PROTIME-INR
INR: 1
Prothrombin Time: 13.7 secs (ref 11.5–14.7)

## 2012-05-07 LAB — CK TOTAL AND CKMB (NOT AT ARMC)
CK, Total: 31 U/L — ABNORMAL LOW (ref 35–232)
CK, Total: 29 U/L — ABNORMAL LOW (ref 35–232)
CK-MB: 1.3 ng/mL (ref 0.5–3.6)

## 2012-05-07 LAB — CBC
HCT: 33.3 % — ABNORMAL LOW (ref 40.0–52.0)
MCHC: 32.5 g/dL (ref 32.0–36.0)
Platelet: 308 10*3/uL (ref 150–440)
WBC: 7.4 10*3/uL (ref 3.8–10.6)

## 2012-05-07 LAB — T4, FREE: Free Thyroxine: 0.92 ng/dL (ref 0.76–1.46)

## 2012-05-07 LAB — TSH: Thyroid Stimulating Horm: 9.61 u[IU]/mL — ABNORMAL HIGH

## 2012-05-08 LAB — CBC WITH DIFFERENTIAL/PLATELET
Basophil #: 0 10*3/uL (ref 0.0–0.1)
Basophil %: 0.5 %
Eosinophil %: 1.7 %
HGB: 9.5 g/dL — ABNORMAL LOW (ref 13.0–18.0)
Lymphocyte %: 25 %
MCHC: 32.1 g/dL (ref 32.0–36.0)
MCV: 91 fL (ref 80–100)
Monocyte #: 0.6 x10 3/mm (ref 0.2–1.0)
Monocyte %: 8.3 %
Neutrophil #: 4.4 10*3/uL (ref 1.4–6.5)
Neutrophil %: 64.5 %
Platelet: 241 10*3/uL (ref 150–440)
RBC: 3.24 10*6/uL — ABNORMAL LOW (ref 4.40–5.90)
RDW: 16.9 % — ABNORMAL HIGH (ref 11.5–14.5)
WBC: 6.8 10*3/uL (ref 3.8–10.6)

## 2012-05-08 LAB — BASIC METABOLIC PANEL
Anion Gap: 4 — ABNORMAL LOW (ref 7–16)
Calcium, Total: 8.7 mg/dL (ref 8.5–10.1)
Chloride: 108 mmol/L — ABNORMAL HIGH (ref 98–107)
Co2: 27 mmol/L (ref 21–32)
Creatinine: 2.4 mg/dL — ABNORMAL HIGH (ref 0.60–1.30)
EGFR (African American): 27 — ABNORMAL LOW
EGFR (Non-African Amer.): 23 — ABNORMAL LOW
Glucose: 69 mg/dL (ref 65–99)
Potassium: 4.7 mmol/L (ref 3.5–5.1)

## 2012-05-08 LAB — TROPONIN I: Troponin-I: 0.02 ng/mL

## 2012-05-08 LAB — CK TOTAL AND CKMB (NOT AT ARMC): CK, Total: 28 U/L — ABNORMAL LOW (ref 35–232)

## 2012-05-09 LAB — CBC WITH DIFFERENTIAL/PLATELET
Basophil %: 0.4 %
Eosinophil %: 1.4 %
HGB: 10.6 g/dL — ABNORMAL LOW (ref 13.0–18.0)
Lymphocyte #: 0.9 10*3/uL — ABNORMAL LOW (ref 1.0–3.6)
Lymphocyte %: 9.7 %
MCV: 90 fL (ref 80–100)
Monocyte #: 0.6 x10 3/mm (ref 0.2–1.0)
Monocyte %: 6.5 %
Neutrophil %: 82 %
RBC: 3.59 10*6/uL — ABNORMAL LOW (ref 4.40–5.90)
RDW: 16.5 % — ABNORMAL HIGH (ref 11.5–14.5)

## 2012-05-09 LAB — BASIC METABOLIC PANEL
Anion Gap: 7 (ref 7–16)
Calcium, Total: 9.1 mg/dL (ref 8.5–10.1)
Co2: 27 mmol/L (ref 21–32)
Creatinine: 2.45 mg/dL — ABNORMAL HIGH (ref 0.60–1.30)
EGFR (Non-African Amer.): 22 — ABNORMAL LOW
Glucose: 68 mg/dL (ref 65–99)
Osmolality: 280 (ref 275–301)
Potassium: 4.5 mmol/L (ref 3.5–5.1)
Sodium: 137 mmol/L (ref 136–145)

## 2012-05-10 LAB — CBC WITH DIFFERENTIAL/PLATELET
Basophil #: 0 10*3/uL (ref 0.0–0.1)
Basophil %: 0.5 %
Eosinophil %: 0.4 %
HGB: 9.5 g/dL — ABNORMAL LOW (ref 13.0–18.0)
Lymphocyte #: 0.8 10*3/uL — ABNORMAL LOW (ref 1.0–3.6)
Lymphocyte %: 16.2 %
MCH: 30.1 pg (ref 26.0–34.0)
MCHC: 33 g/dL (ref 32.0–36.0)
MCV: 91 fL (ref 80–100)
Monocyte #: 0.4 x10 3/mm (ref 0.2–1.0)
Monocyte %: 8.2 %
RBC: 3.15 10*6/uL — ABNORMAL LOW (ref 4.40–5.90)
RDW: 16.4 % — ABNORMAL HIGH (ref 11.5–14.5)

## 2012-05-10 LAB — BASIC METABOLIC PANEL
Anion Gap: 8 (ref 7–16)
Co2: 24 mmol/L (ref 21–32)
Creatinine: 2.37 mg/dL — ABNORMAL HIGH (ref 0.60–1.30)
EGFR (African American): 27 — ABNORMAL LOW
Osmolality: 278 (ref 275–301)
Potassium: 4.3 mmol/L (ref 3.5–5.1)

## 2012-05-10 LAB — PHENYTOIN LEVEL, TOTAL: Dilantin: 14 ug/mL (ref 10.0–20.0)

## 2012-05-11 LAB — BASIC METABOLIC PANEL
Anion Gap: 4 — ABNORMAL LOW (ref 7–16)
Calcium, Total: 8.5 mg/dL (ref 8.5–10.1)
Chloride: 106 mmol/L (ref 98–107)
Co2: 28 mmol/L (ref 21–32)
Creatinine: 2.24 mg/dL — ABNORMAL HIGH (ref 0.60–1.30)
EGFR (African American): 29 — ABNORMAL LOW
EGFR (Non-African Amer.): 25 — ABNORMAL LOW
Glucose: 81 mg/dL (ref 65–99)
Sodium: 138 mmol/L (ref 136–145)

## 2012-05-11 LAB — CBC WITH DIFFERENTIAL/PLATELET
HCT: 30.3 % — ABNORMAL LOW (ref 40.0–52.0)
HGB: 10 g/dL — ABNORMAL LOW (ref 13.0–18.0)
Lymphocyte %: 22.4 %
Monocyte #: 0.7 x10 3/mm (ref 0.2–1.0)
Monocyte %: 12.1 %
Neutrophil #: 3.5 10*3/uL (ref 1.4–6.5)
Neutrophil %: 63.5 %
RDW: 16.8 % — ABNORMAL HIGH (ref 11.5–14.5)

## 2012-05-12 LAB — BASIC METABOLIC PANEL
Anion Gap: 5 — ABNORMAL LOW (ref 7–16)
BUN: 29 mg/dL — ABNORMAL HIGH (ref 7–18)
Calcium, Total: 8.2 mg/dL — ABNORMAL LOW (ref 8.5–10.1)
Chloride: 106 mmol/L (ref 98–107)
Creatinine: 2.13 mg/dL — ABNORMAL HIGH (ref 0.60–1.30)
EGFR (African American): 31 — ABNORMAL LOW
EGFR (Non-African Amer.): 27 — ABNORMAL LOW
Glucose: 79 mg/dL (ref 65–99)
Osmolality: 279 (ref 275–301)
Potassium: 4.2 mmol/L (ref 3.5–5.1)

## 2012-05-13 LAB — CULTURE, BLOOD (SINGLE)

## 2012-05-14 ENCOUNTER — Ambulatory Visit: Payer: Self-pay | Admitting: Nurse Practitioner

## 2012-05-14 ENCOUNTER — Encounter: Payer: Self-pay | Admitting: Internal Medicine

## 2012-05-14 LAB — COMPREHENSIVE METABOLIC PANEL
Alkaline Phosphatase: 153 U/L — ABNORMAL HIGH (ref 50–136)
Anion Gap: 9 (ref 7–16)
BUN: 21 mg/dL — ABNORMAL HIGH (ref 7–18)
Bilirubin,Total: 0.2 mg/dL (ref 0.2–1.0)
Chloride: 106 mmol/L (ref 98–107)
Co2: 24 mmol/L (ref 21–32)
Osmolality: 279 (ref 275–301)
SGOT(AST): 21 U/L (ref 15–37)
Sodium: 139 mmol/L (ref 136–145)
Total Protein: 5.9 g/dL — ABNORMAL LOW (ref 6.4–8.2)

## 2012-05-14 LAB — CBC WITH DIFFERENTIAL/PLATELET
Basophil #: 0 10*3/uL (ref 0.0–0.1)
Basophil %: 0.6 %
HGB: 9.5 g/dL — ABNORMAL LOW (ref 13.0–18.0)
Lymphocyte %: 28.6 %
MCH: 30.4 pg (ref 26.0–34.0)
MCV: 90 fL (ref 80–100)
Monocyte #: 0.6 x10 3/mm (ref 0.2–1.0)
Monocyte %: 10.3 %
Neutrophil #: 3.2 10*3/uL (ref 1.4–6.5)
Neutrophil %: 56.6 %
Platelet: 167 10*3/uL (ref 150–440)
RBC: 3.11 10*6/uL — ABNORMAL LOW (ref 4.40–5.90)
RDW: 16.8 % — ABNORMAL HIGH (ref 11.5–14.5)
WBC: 5.6 10*3/uL (ref 3.8–10.6)

## 2012-05-14 LAB — PHENYTOIN LEVEL, TOTAL: Dilantin: 14.5 ug/mL (ref 10.0–20.0)

## 2012-05-24 ENCOUNTER — Emergency Department: Payer: Self-pay | Admitting: Emergency Medicine

## 2012-05-24 LAB — URINALYSIS, COMPLETE
Bilirubin,UR: NEGATIVE
Squamous Epithelial: NONE SEEN

## 2012-05-24 LAB — COMPREHENSIVE METABOLIC PANEL
Albumin: 3 g/dL — ABNORMAL LOW (ref 3.4–5.0)
Anion Gap: 7 (ref 7–16)
BUN: 30 mg/dL — ABNORMAL HIGH (ref 7–18)
Calcium, Total: 8.8 mg/dL (ref 8.5–10.1)
Creatinine: 2.43 mg/dL — ABNORMAL HIGH (ref 0.60–1.30)
EGFR (African American): 26 — ABNORMAL LOW
EGFR (Non-African Amer.): 23 — ABNORMAL LOW
Glucose: 87 mg/dL (ref 65–99)
Osmolality: 276 (ref 275–301)
Potassium: 4.5 mmol/L (ref 3.5–5.1)
SGOT(AST): 19 U/L (ref 15–37)
SGPT (ALT): 14 U/L (ref 12–78)
Sodium: 135 mmol/L — ABNORMAL LOW (ref 136–145)

## 2012-05-24 LAB — CBC
MCV: 89 fL (ref 80–100)
Platelet: 236 10*3/uL (ref 150–440)
WBC: 7.2 10*3/uL (ref 3.8–10.6)

## 2012-05-24 LAB — TROPONIN I: Troponin-I: <0.02 ng/mL

## 2012-05-30 LAB — BASIC METABOLIC PANEL
Anion Gap: 8 (ref 7–16)
BUN: 43 mg/dL — ABNORMAL HIGH (ref 7–18)
Calcium, Total: 9 mg/dL (ref 8.5–10.1)
Chloride: 105 mmol/L (ref 98–107)
EGFR (African American): 22 — ABNORMAL LOW
Glucose: 86 mg/dL (ref 65–99)
Osmolality: 286 (ref 275–301)
Potassium: 4.6 mmol/L (ref 3.5–5.1)

## 2012-05-30 LAB — CBC WITH DIFFERENTIAL/PLATELET
Basophil #: 0.1 10*3/uL (ref 0.0–0.1)
Basophil %: 0.9 %
Eosinophil #: 0.2 10*3/uL (ref 0.0–0.7)
Eosinophil %: 2 %
HGB: 11.2 g/dL — ABNORMAL LOW (ref 13.0–18.0)
Lymphocyte #: 1.2 10*3/uL (ref 1.0–3.6)
Lymphocyte %: 14.5 %
MCH: 29.9 pg (ref 26.0–34.0)
MCHC: 33.5 g/dL (ref 32.0–36.0)
Monocyte #: 0.7 x10 3/mm (ref 0.2–1.0)
Neutrophil #: 6 10*3/uL (ref 1.4–6.5)
Neutrophil %: 74.4 %
RBC: 3.73 10*6/uL — ABNORMAL LOW (ref 4.40–5.90)
RDW: 16.1 % — ABNORMAL HIGH (ref 11.5–14.5)

## 2012-05-30 LAB — COMPREHENSIVE METABOLIC PANEL
Alkaline Phosphatase: 171 U/L — ABNORMAL HIGH (ref 50–136)
Anion Gap: 10 (ref 7–16)
BUN: 43 mg/dL — ABNORMAL HIGH (ref 7–18)
Calcium, Total: 9.2 mg/dL (ref 8.5–10.1)
Co2: 23 mmol/L (ref 21–32)
Glucose: 86 mg/dL (ref 65–99)
Osmolality: 288 (ref 275–301)
Potassium: 4.6 mmol/L (ref 3.5–5.1)
SGOT(AST): 19 U/L (ref 15–37)
SGPT (ALT): 18 U/L (ref 12–78)
Total Protein: 6.9 g/dL (ref 6.4–8.2)

## 2012-05-30 LAB — TSH: Thyroid Stimulating Horm: 4.44 u[IU]/mL

## 2012-05-30 LAB — T4, FREE: Free Thyroxine: 1.03 ng/dL (ref 0.76–1.46)

## 2012-06-13 ENCOUNTER — Ambulatory Visit: Payer: Self-pay | Admitting: Internal Medicine

## 2012-07-14 DEATH — deceased

## 2013-07-15 ENCOUNTER — Ambulatory Visit: Payer: Self-pay | Admitting: Internal Medicine

## 2014-02-19 IMAGING — CR PELVIS - 1-2 VIEW
1 series · 1 of 1 positions shown · non-contrast
Comparison: None

REASON FOR EXAM: evaluate for left hip  fracture
COMMENTS:

PROCEDURE:     DXR - DXR PELVIS AP ONLY  - April 12, 2012  [DATE]
RESULT:     History: Left hip fracture

[t pelvis ap]
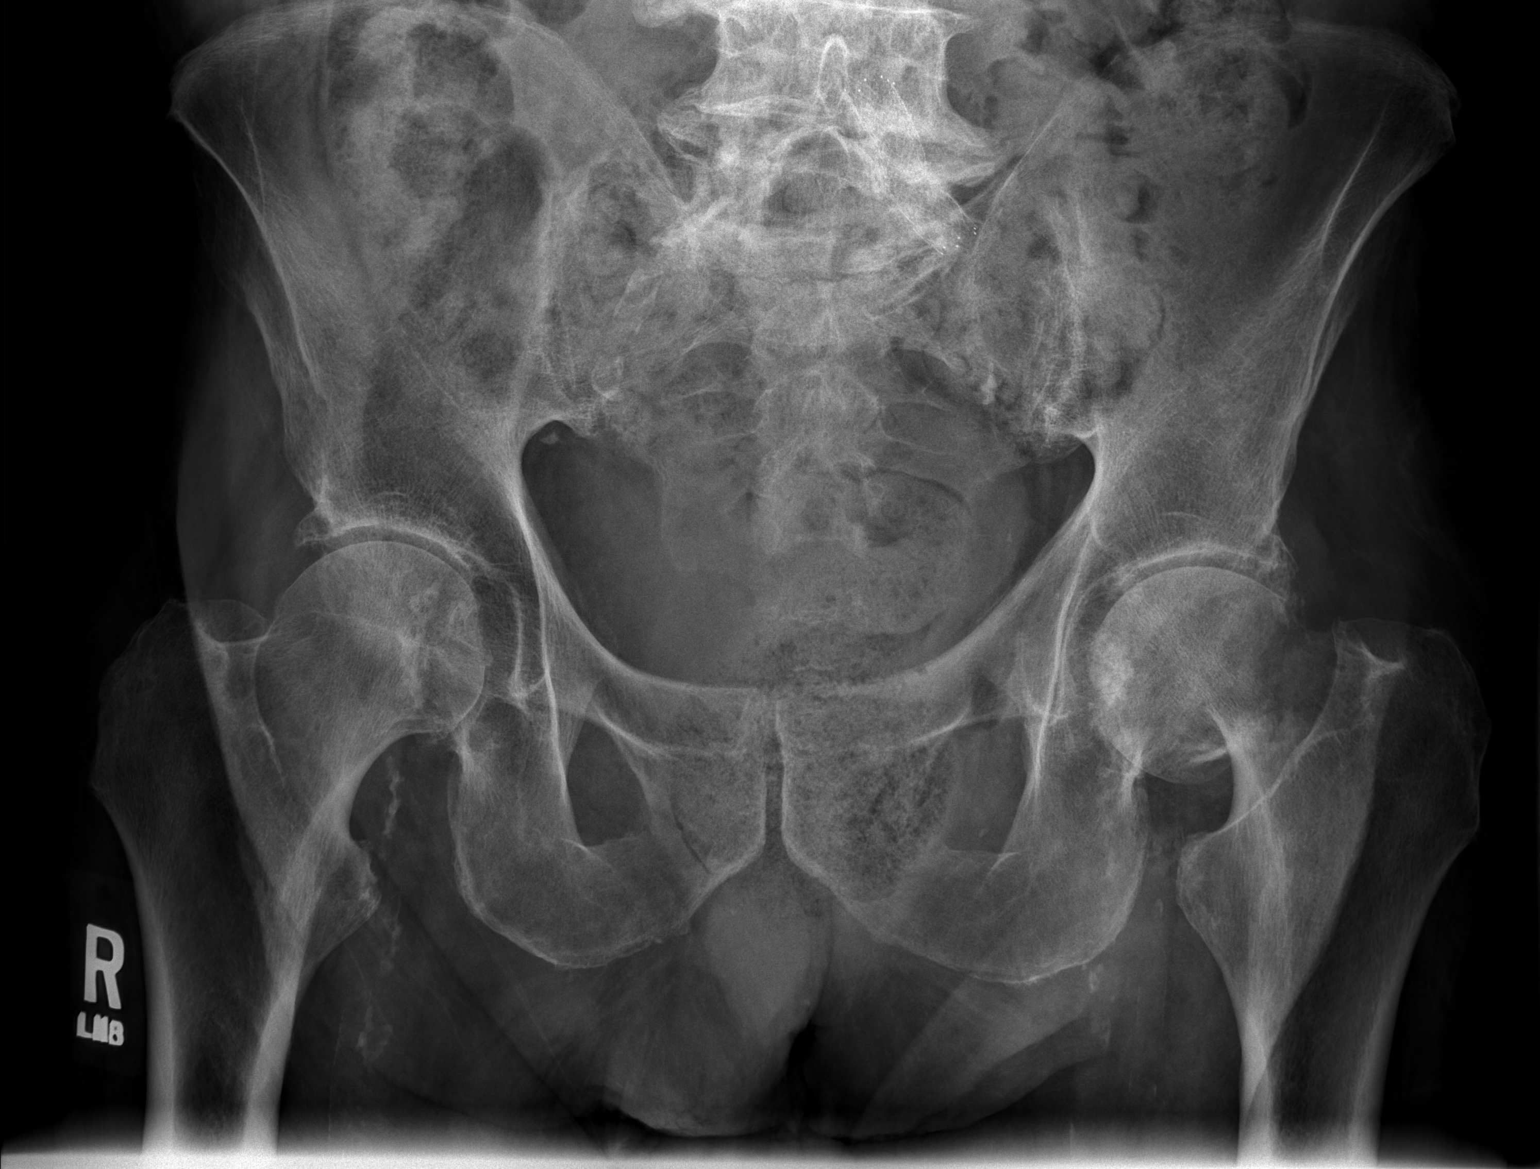

[1 of 1 positions shown; findings below may reference images not displayed]

FINDINGS: AP pelvis demonstrates a mildly displaced left femoral neck fracture. The
right Hip is unremarkable. The joint spaces are maintained. The sacroiliac
joints are unremarkable.
IMPRESSION: Minimally displaced left femoral neck fracture.

## 2014-02-24 IMAGING — CR DG CHEST 1V PORT
1 series · 1 of 1 positions shown · non-contrast
Comparison: none

REASON FOR EXAM: fever
COMMENTS:

[ap]
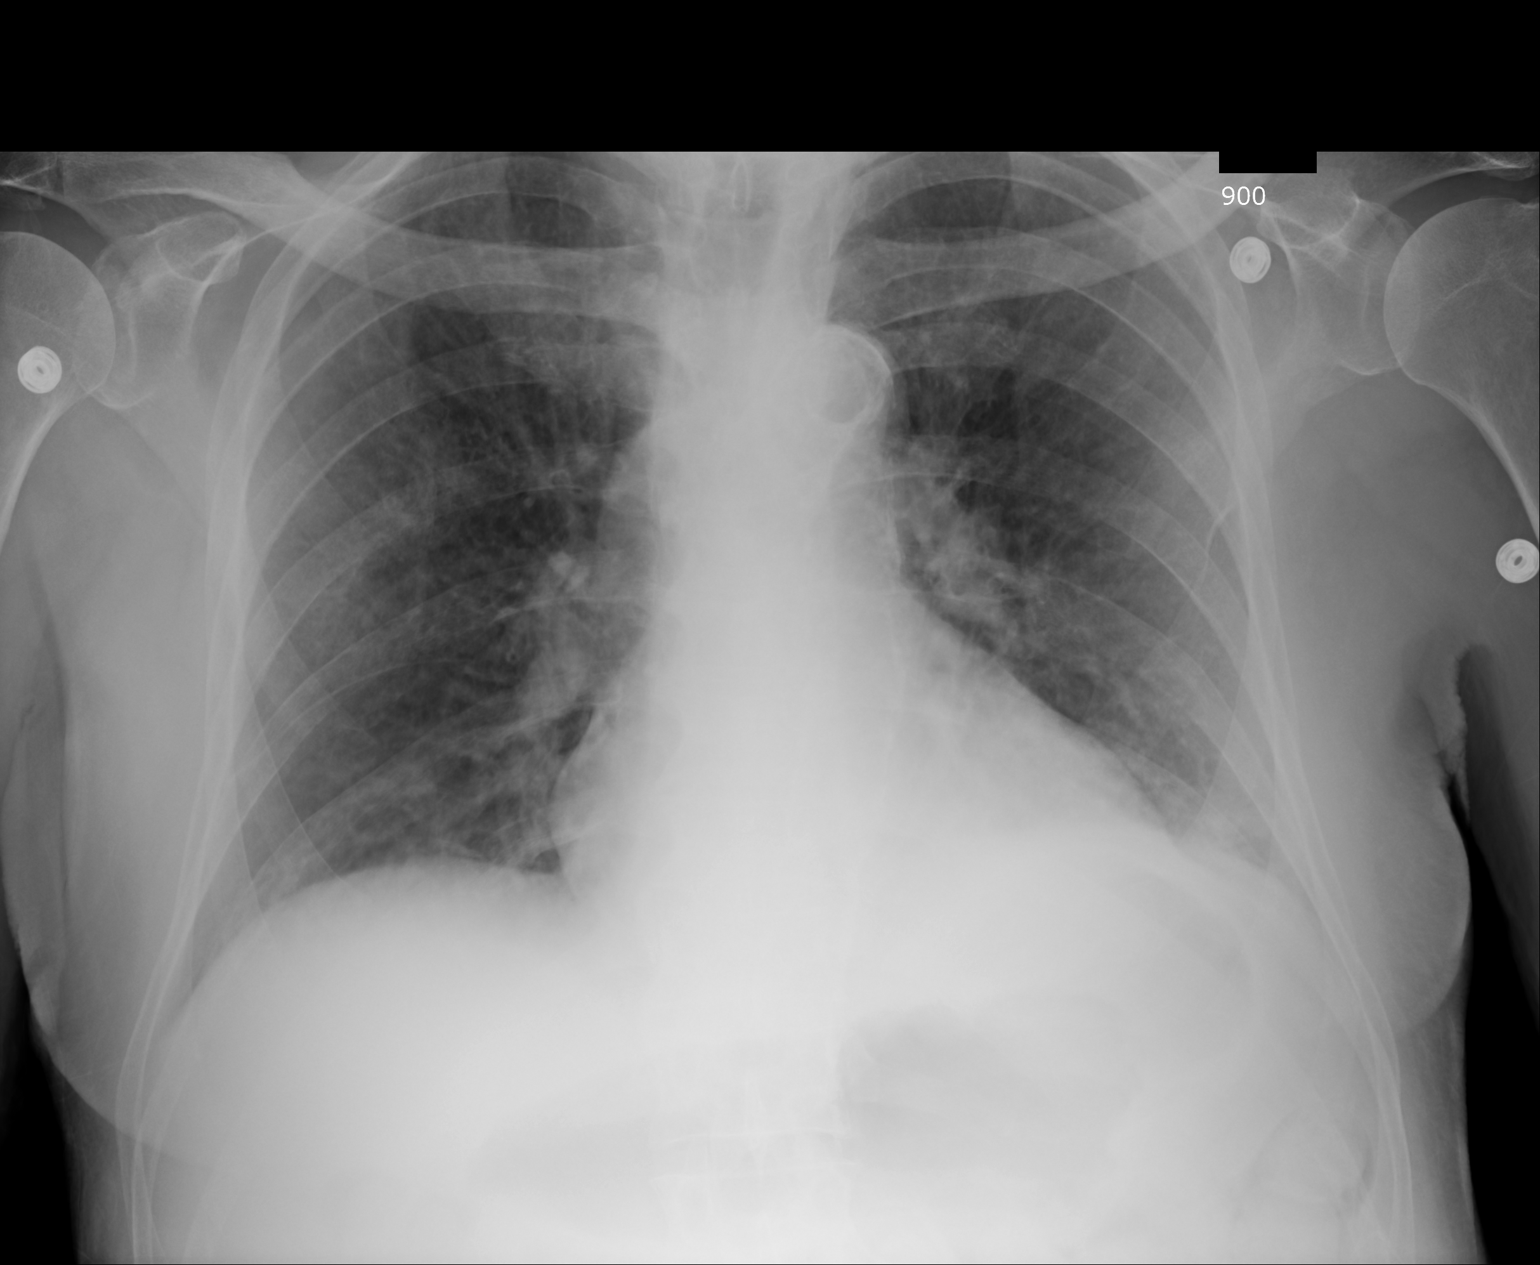

[1 of 1 positions shown; findings below may reference images not displayed]

PROCEDURE:     DXR - DXR PORTABLE CHEST SINGLE VIEW  - April 17, 2012  [DATE]

RESULT:     Comparison is made to prior study dated 04/15/2012.

The patient has taken a shallow inspiration. With technique taken into
consideration no focal regions of consolidation are appreciated. There is
mild prominence of the interstitial markings a component of which is likely
due to technique. The cardiac silhouette is mildly enlarged. The visualized
bony skeleton is grossly unremarkable.
IMPRESSION: 1. Shallow inspiration.
2. Interstitial prominence a component of which is likely secondary to
technique. No focal regions of consolidation or focal infiltrates.

## 2014-03-17 IMAGING — CT CT HEAD WITHOUT CONTRAST
1 series · 15 of 30 positions shown, 19 images · non-contrast
Comparison: none

REASON FOR EXAM: alterd mental status
COMMENTS:

PROCEDURE:     CT  - CT HEAD WITHOUT CONTRAST  - May 08, 2012 [DATE]
RESULT:     Comparison:  05/07/2012, 06/11/2010
TECHNIQUE: Multiple axial images from the foramen magnum to the vertex were
obtained without IV contrast.

[Series 2: soft tissue · axial · 0.45mm/px · z∈[-22,+113]mm · 15 of 31 slices shown, 19 images]
[im 2/31  brain]
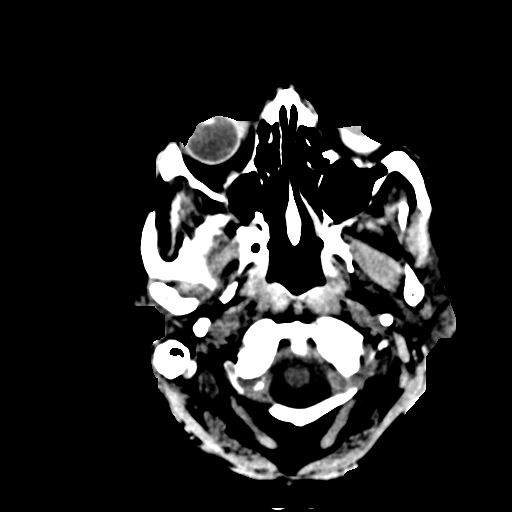
[im 2/31  bone]
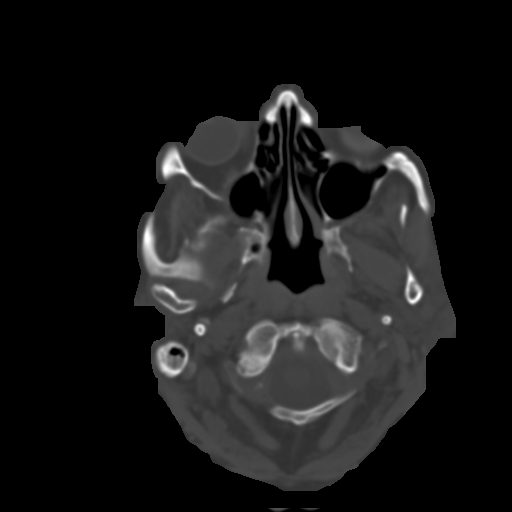
[im 4/31  brain]
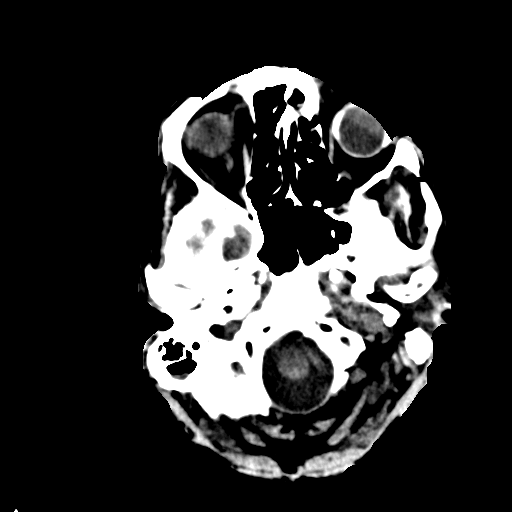
[im 6/31  brain]
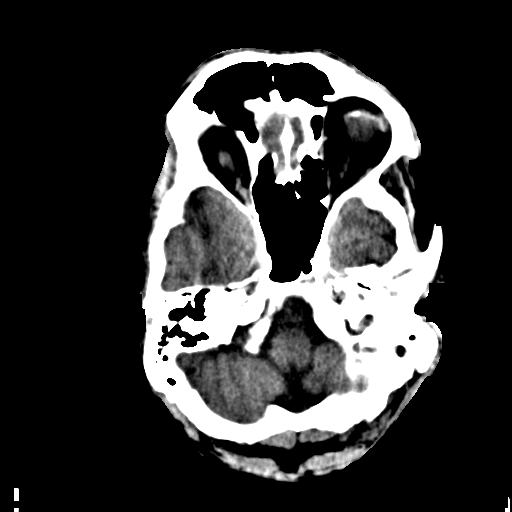
[im 8/31  brain]
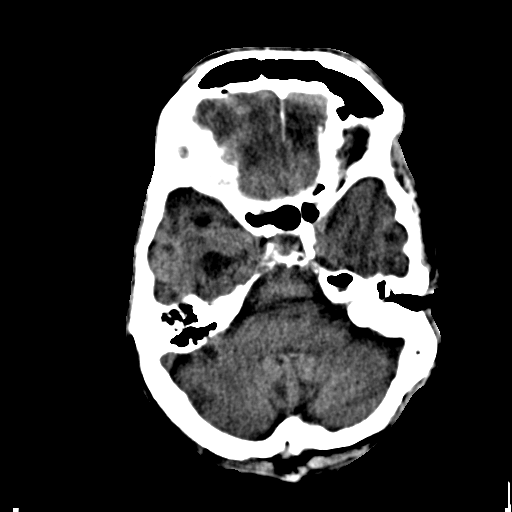
[im 10/31  brain]
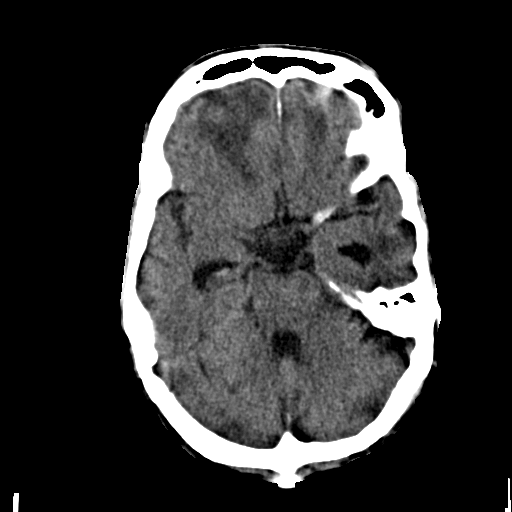
[im 10/31  bone]
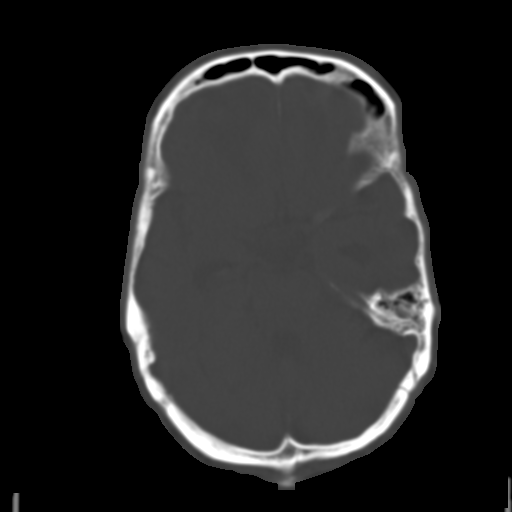
[im 12/31  brain]
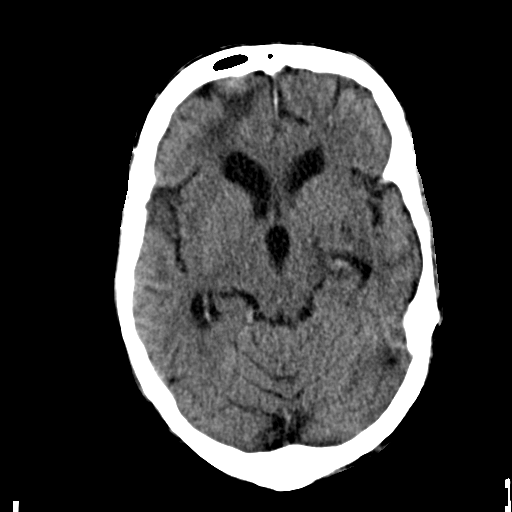
[im 14/31  brain]
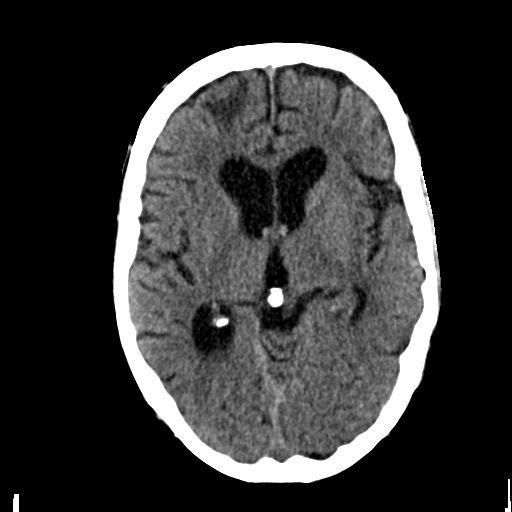
[im 16/31  brain]
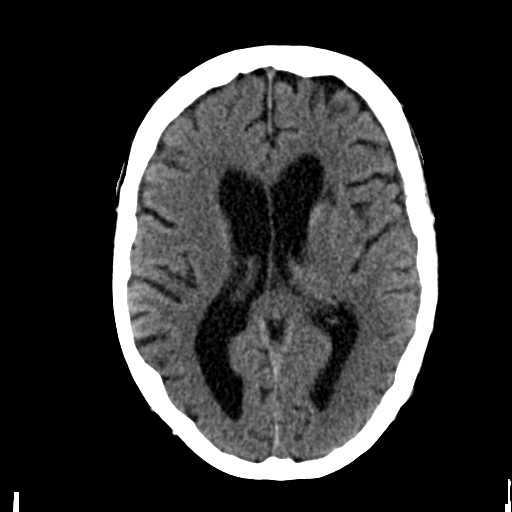
[im 17/31  brain]
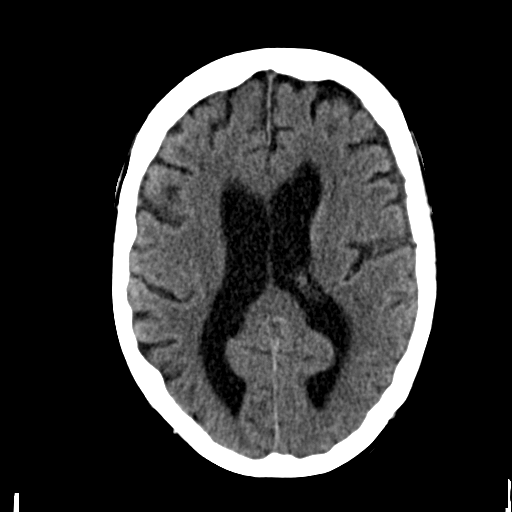
[im 17/31  bone]
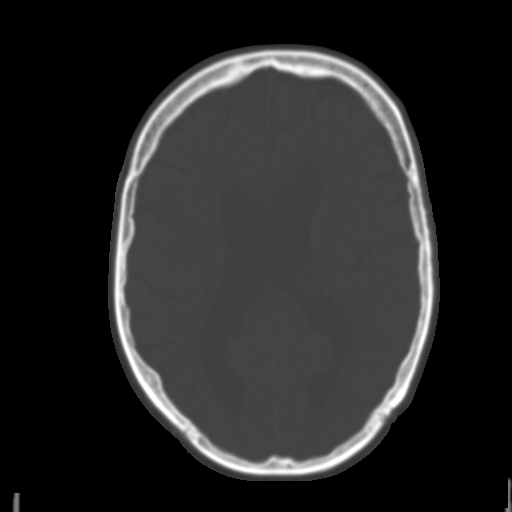
[im 19/31  brain]
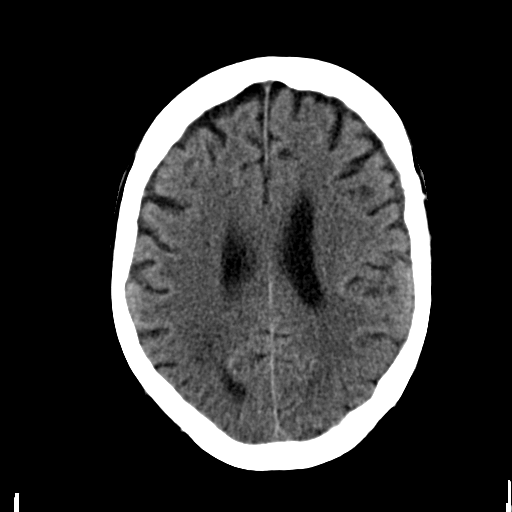
[im 21/31  brain]
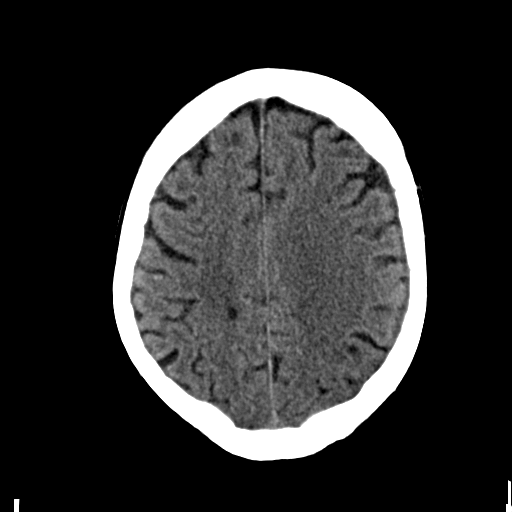
[im 23/31  brain]
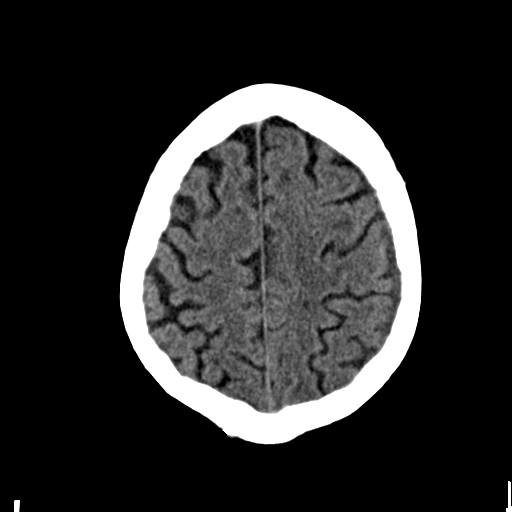
[im 25/31  brain]
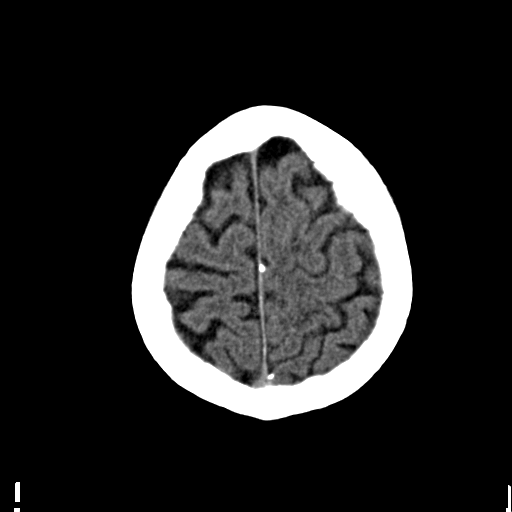
[im 25/31  bone]
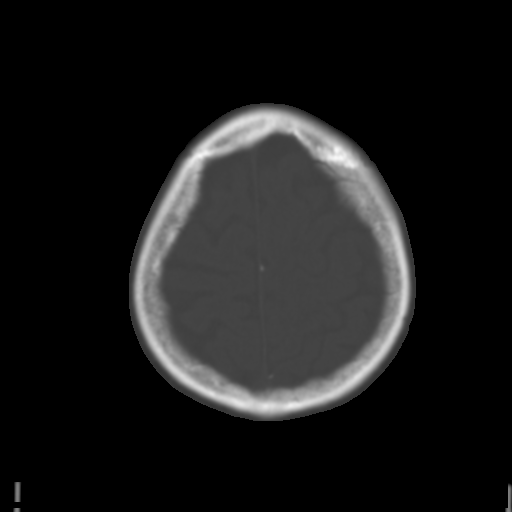
[im 27/31  brain]
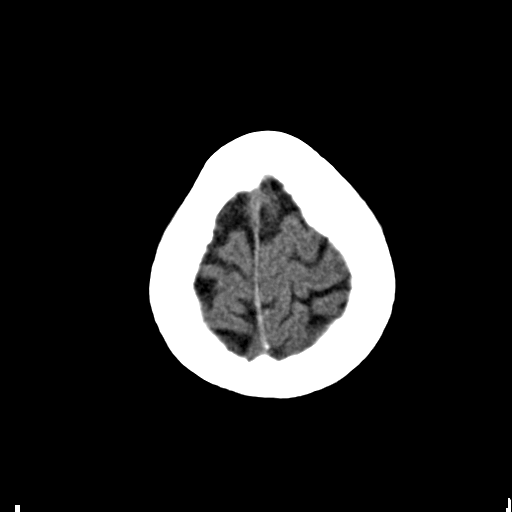
[im 29/31  brain]
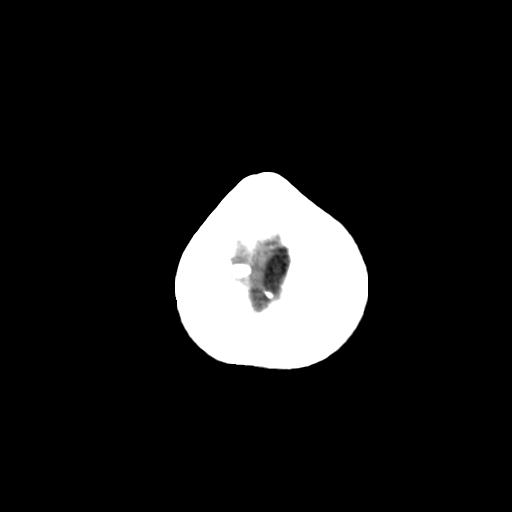

[15 of 30 positions shown; findings below may reference images not displayed]

FINDINGS: There are focal areas of hypoattenuation involving the inferior aspect of
the bilateral frontal lobes. There appears to be some effacement of the
gray-white junction. These are nonspecific, but possibilities would include
age-indeterminate infarcts as well as contusions given the recent history of
trauma. There are some peripheral areas of hyperdensity in these regions
which could be artifactual secondary to the adjacent cortex, but
superimposed hemorrhage cannot be excluded. No midline shift. Mild
prominence of the ventricular system is likely due to central atrophy.

There is opacification of the left mastoid air cells, which is nonspecific.
Correlate for mastoiditis. There is a small amount of fluid in the right
mastoid air cells.
IMPRESSION: There are areas of hypoattenuation involving the inferior frontal lobes,
right greater than left. These are of uncertain etiology, but possibly
age-indeterminate infarcts or potentially contusions given the history of
trauma. There is some hyperattenuation within this regions which raises the
possibility of superimposed hemorrhage. Further evaluation could be provided
with MRI. At a minimum, followup head CT is recommended in 12 hours.

This was discussed with Dr. Leaky Tsering at 1401 hours 05/08/2012.

## 2014-06-02 NOTE — Op Note (Signed)
PATIENT NAME:  Josie SaundersRICHARD, Gable MR#:  [id number] DATE OF BIRTH:  09-02-1920  DATE OF PROCEDURE:  08/31/2011  PREOPERATIVE DIAGNOSIS: Arthritis, synovitis, gout, left knee.   POSTOPERATIVE DIAGNOSIS:  Arthritis, synovitis, gout, left knee.  PROCEDURE: Arthroscopic synovectomy, left knee.   ANESTHESIA: General.   SURGEON: Leitha SchullerMichael J. Ryoma Nofziger, MD  DESCRIPTION OF PROCEDURE: The patient was brought to the operating room and after adequate anesthesia was obtained, the left leg was placed in the arthroscopic legholder with tourniquet applied. After prepping and draping in the usual sterile manner an anterolateral incision was made and the arthroscope was introduced. There was a large amount of fluid present within the knee that was cloudy. At this point the arthroscope was introduced and there was a  crystalline material within the joint along with synovial fluid. This was irrigated out. After this had been done, on inspection there was marked synovitis in the suprapatellar pouch as well as along both gutters as well as anteriorly in the joint space, predominantly on the lateral side. Inspection of the articular surface showed exposed bone over the femoral trochlea and patella. The medial compartment also had extensive degenerative change. The anterior cruciate ligament was intact. The lateral compartment had exposed bone over most of the weight-bearing surface. After this initial look with probing, the arthroscopic shaver was used. The tourniquet was raised to minimize bleeding during this part of the procedure to aid in visualization. The tourniquet was raised to 300 mmHg. Extensive synovectomy was carried out in all compartments with the ArthroCare wand also being used to aid in hemostasis. Pre- and postprocedure pictures had been obtained. Following this, after the tourniquet had been up approximately 30 minutes with thorough shaving, the knee was thoroughly irrigated until clear. All instrumentation was  withdrawn. The wound was infiltrated with 20 mL of 0.5% Sensorcaine with epinephrine. Xeroform, 4 x 4's, Webril, ABDs, and Ace wrap were applied. The tourniquet was let down. Tourniquet time was 30 minutes at 300 mmHg.   COMPLICATIONS: There were no complications.    SPECIMEN: No specimen as he has had multiple prior aspirations and cultures.    ESTIMATED BLOOD LOSS:   Minimal.  ____________________________ Leitha SchullerMichael J. Torrance Frech, MD mjm:bjt D: 08/31/2011 20:15:49 ET T: 09/01/2011 11:32:05 ET JOB#: [id number]  cc: Leitha SchullerMichael J. Smayan Hackbart, MD, <Dictator> Leitha SchullerMICHAEL J Divya Munshi MD ELECTRONICALLY SIGNED 09/03/2011 18:46

## 2014-06-05 NOTE — Discharge Summary (Signed)
PATIENT NAME:  [name] [name], [name] [name] MR#:  [id number] DATE OF BIRTH:  1921/01/26  DATE OF ADMISSION:  05/07/2012 DATE OF DISCHARGE:  05/14/2012  TYPE OF DISCHARGE:  The patient transferred to skilled nursing facility.  REASON FOR ADMISSION:  Altered mental status.  HISTORY OF PRESENT ILLNESS:  The patient is an 81 year old male with a significant history of coronary artery disease status post PTCA with stent placement, peripheral vascular disease, and stage 4 chronic kidney disease who was at the skilled nursing facility after hip surgery.  Developed change in mental status and was brought to the emergency room where he was admitted for further evaluation.  In the emergency room, the patient was hypotensive and tachycardic.  He was also mildly hypoxic.  He was very confused.  PAST MEDICAL HISTORY: 1.  Atherosclerotic cardiovascular disease status post PTCA with stent placement. 2.  Peripheral vascular disease status post left lower extremity stent and renal artery stent. 3.  Carotid endarterectomy on the right. 4.  Stage 4 chronic kidney disease. 5.  Essential hypertension. 6.  Hyperlipidemia. 7.  BPH. 8.  Diverticulosis. 9.  Recurrent esophagitis. 10.  Hyperuricemia. 11.  Status post bilateral inguinal hernia repair. 12.  Status post left knee surgery. 13.  Status post left hip surgery.  MEDICATIONS ON ADMISSION:  Please see admission note.  ALLERGIES:  FLAGYL.  SOCIAL HISTORY:  The patient quit smoking several years ago.  No history of alcohol abuse.  FAMILY HISTORY:  Positive for coronary artery disease.  REVIEW OF SYSTEMS:  As per admission note.  PHYSICAL EXAMINATION: GENERAL APPEARANCE:  The patient is chronically ill appearing in no acute distress. VITAL SIGNS:  Stable. HEENT:  Exam was unremarkable. NECK:  Supple without JVD. LUNGS:  Decreased breath sounds at the bases. CARDIAC:  Regular rate and rhythm with normal S1, S2.  There was a III/VI systolic murmur  noted. ABDOMEN:  Soft, nontender. EXTREMITIES:  Without edema. NEUROLOGIC:  Exam is grossly nonfocal other than the patient's mental status.  HOSPITAL COURSE:  The patient was admitted with altered mental status and confusion.  Initial CT of the head showed chronic changes.  Repeat head CT done since the patient could not have an MRI because of his hip surgery revealed questionable hemorrhage.  He had a third head CT which showed only chronic changes.  His altered mental status was felt not to be related to recurrent stroke.  He was seen by Neurology.  EEG was unremarkable.  It was felt that the patient may be having seizures.  He was placed on Dilantin and amantadine empirically with some improvement of his mental status.  The patient otherwise remains stable.  His renal function and vitals remain stable.  His oxygen remains stable.  Placement was once again recommended and the family agreed.  The patient is now transferred to a skilled nursing facility for further care and treatment.  DISCHARGE DIAGNOSES: 1.  Metabolic encephalopathy with altered mental status. 2.  Presumed seizures. 3.  Recent left hip surgery. 4.  Atherosclerotic cardiovascular disease status post percutaneous transluminal coronary angioplasty with stent placement. 5.  Essential hypertension. 6.  Hyperlipidemia. 7.  Benign prostatic hypertrophy. 8.  Stage 4 chronic kidney disease. 9.  Gastroesophageal reflux disease. 10.  Anemia of chronic disease. 11.  Hyperuricemia. 12.  Peripheral vascular disease status post right carotid endarterectomy.  DISCHARGE MEDICATIONS: 1.  Combivent one puff 4 times a day. 2.  Aspirin 325 mg p.o. daily. 3.  Betaxolol eye drops as directed. 4.  Brimonidine eye drops as directed. 5.  Os-Cal D one p.o. b.i.d. 6.  Vitamin D3 1000 units p.o. daily. 7.  Plavix 75 mg p.o. daily. 8.  Surfak 240 mg p.o. at bedtime. 9.  Iron sulfate 325 mg p.o. b.i.d. 10.  Protonix 40 mg p.o. daily. 11.  Zocor  10 mg p.o. at bedtime. 12.  Flomax 0.4 mg p.o. daily. 13.  Coreg 25 mg p.o. b.i.d. 14.  Clonidine 0.1 mg p.o. b.i.d. 15.  Dilantin 100 mg p.o. q.8 hours. 16.  Seroquel 12.5 mg p.o. at bedtime. 17.  Amantadine 100 mg p.o. q.12 hours.  FOLLOWUP PLANS AND APPOINTMENTS:  The patient will be followed by Dr. Ouida Sills at [facility].  He is a NO CODE BLUE, DO NOT RESUSCITATE.  He is on a 2-gram sodium diet.  Will supplement oxygen as needed.  Will obtain a Physical Therapy consult for ambulation.  Knee high TED hose worn daily, off at bedtime.  CBC and a MET-B in 1 week.    ____________________________ Leonie Douglas. Doy Hutching, MD jds:es D: 05/14/2012 07:51:40 ET T: 05/14/2012 08:04:28 ET JOB#: [id number]  cc: Leonie Douglas. Doy Hutching, MD, <Dictator> JEFFREY Lennice Sites MD ELECTRONICALLY SIGNED 05/14/2012 10:43

## 2014-06-05 NOTE — H&P (Signed)
PATIENT NAME:  [name] [name], COBERN MR#:  [id number] DATE OF BIRTH:  [birth year]  DATE OF ADMISSION:  05/07/2012  ADMITTING PHYSICIAN: Enid Baas, MD   PRIMARY CARE PHYSICIAN: Dr. Aram Beecham, but over the last 3 weeks the patient is being seen by Dr. Einar Crow at [facility].   CHIEF COMPLAINT: Change in mental status.   HISTORY OF PRESENT ILLNESS: The patient is an 81 year old elderly Caucasian male with multiple medical problems including coronary artery disease, status post stent placement; peripheral vascular disease, status post carotid endarterectomy, left leg stents; stage IV CKD with baseline creatinine around 2.5 to 3; hypertension; recent hospitalizations after falls, the first one being at the end of January when he fell at [facility], was admitted to [facility]. According to daughters, he had possible subdural hematoma. They watched him, did not require any intervention, was discharged to [facility] and he was discharged home finally. A week later, he had a fall when [facility], admitted to the hospital, had left hip fracture and surgery done and was just discharged to [facility] 3 weeks ago. Prior to his last couple of admissions, he was very functional, living at home with his 62 year old wife, driving, able to do all his activities, but since his last couple of hospitalizations he has never completely recovered physically and mentally. At baseline now, he is mostly bedbound, just started walking with physical therapy and takes 45 steps a day at maximum. He has been confused but able to recognize family, but mostly talking about his past conversations rather than present conversations lately. He had a followup appointment [facility] for a chronic nonhealing leg ulcer/heel ulcer/pressure ulcer and was at [facility] today and felt he was almost passing out and since then acting acutely confused. He was hypotensive with  blood pressure 60/38, tachycardic with 114 heart rate and hypoxic with 85% sats on room air. He was brought to the ER. His blood pressure and heart rate improved, even without fluids. He is saturating fine but still acting acutely confused, very withdrawn at this time.   PAST MEDICAL HISTORY:  1.  Coronary artery disease, status post stent placement in 1993 and 1998.  2.  Peripheral vascular disease, status post left leg stent placed and also renal artery stent.  3.  Carotid endarterectomy on the right side for carotid artery stenosis.  4.  Stage IV CKD with baseline creatinine around 2.5.  5.  Hypertension.  6.  Hyperlipidemia.  7.  Benign prostatic hypertrophy.  8.  Diverticulosis.  9.  Recurrent esophagitis.  10.  Hyperuricemia.   PAST SURGICAL HISTORY:  1.  Bilateral inguinal hernia repair.  2.  Multiple stent placement.  3.  Arthroscopic surgery of left knee.  4.  Recent left hip surgery repair.   ALLERGIES TO MEDICATIONS: ALLERGIC TO FLAGYL.   CURRENT MEDICATIONS: At the rehab:  1.  Norco 5/325 mg 1 to 2 tablets every 4 hours as needed for pain.  2.  Aspirin 325 mg daily.  3.  Betoptic 0.25% ophthalmic solution once a day at bedtime, both eyes.  4.  Bisacodyl 10 mg suppository every 12 hours as needed for constipation.  5.  Brimonidine 0.15% eye drops to both eyes at bedtime.  6.  Plavix 75 mg p.o. daily.  7.  Combivent Respimat 1 puff 4 times a day.  8.  Coreg 12.5 p.o. b.i.d.  9.  Dexamethasone ophthalmic solution 0.1% to each  eye once a day at bedtime.  10.  Colace 240 mg at bedtime.  11.  Ferrous sulfate 325 mg p.o. b.i.d.  12.  Flonase nasal spray 2 sprays each nostril once a day at bedtime.  13.  Imdur 30 mg p.o. daily.  14.  Milk of magnesia 30 mL b.i.d. for constipation as needed.  15.  MiraLax 1 packet each day for constipation.  16.  Calcium/vitamin D 500 mg/200 international units 1 tablet p.o. b.i.d.  17.  Protonix 40 mg p.o. daily.  18.  Quetiapine 25 mg at  bedtime, which was recently started.  19.  Simvastatin 10 mg p.o. daily.  20.  Flomax 0.4 mg p.o. daily.  21.  Vitamin D3 with 1000 international units daily.   SOCIAL HISTORY: Quit smoking several years ago. No alcohol use. Has been at [facility] for the past 3 weeks.  FAMILY HISTORY: Positive for coronary artery disease.   REVIEW OF SYSTEMS: Difficult to obtain secondary to patient's acute confusion.   PHYSICAL EXAMINATION:  VITAL SIGNS: Temperature 97.9 degrees Fahrenheit, pulse 95, respirations 16, blood pressure 164/74, pulse oximetry 96% on room air.  GENERAL: Well built, well nourished male curled up in bed, not really cooperative for exam, does not appear to be in any acute distress.  HEENT: Normocephalic, atraumatic. Pupils equal, round, reacting to light. Anicteric sclerae. Extraocular movements intact. Oropharynx with dry mucous membranes, otherwise clear with no erythema, mass or exudates.  NECK: Supple. No thyromegaly, JVD or carotid bruits. No lymphadenopathy.  LUNGS: Moving air bilaterally. Decreased bibasilar breath sounds. No wheeze or crackles. No use of accessory muscles for breathing.  CARDIOVASCULAR: S1, S2, regular rate and rhythm, III/VI systolic murmur. No rubs or gallops.  ABDOMEN: Soft, nontender, nondistended. No hepatosplenomegaly. Normal bowel sounds.  EXTREMITIES: No pedal edema, no clubbing or cyanosis, 1+ dorsalis pedis pulses palpable bilaterally.  SKIN: No acne, rash or lesions.  LYMPHATICS: No cervical lymphadenopathy.  NEUROLOGIC: He does not have any focal neurological deficits. Able to move all 4 extremities, but not cooperating for a complete neuro exam. No obvious facial droop.  PSYCHOLOGIC: Seems to be withdrawn. Alert but not oriented.   LABORATORY, DIAGNOSTIC AND RADIOLOGICAL DATA: WBC 7.4, hemoglobin 10.8, hematocrit 33.3, platelet count 308. Sodium 135, potassium 4.8, chloride 103, bicarbonate 29, BUN 38, creatinine 2.63, glucose 96, calcium  9.5. ALT 14, AST 18, alkaline phosphatase 191, total bilirubin 0.4, albumin 3.9. INR 1.0. Troponin 0.02. His urinalysis is negative for infection. Chest x-ray: Mild right infrahilar opacities, likely atelectasis. CT of the head without contrast showing areas of chronic infarction, especially in right and left frontal lobes, otherwise chronic involutional changes, no acute abnormalities. EKG showing normal sinus rhythm, heart rate of 84, no acute ST-T wave abnormalities.   ASSESSMENT AND PLAN: This is an 81 year old male with multiple medical problems including coronary artery disease, status post stents, hypertension, recent fall and admission for left hip fracture repair 3 weeks ago, was discharged to rehab, is brought back for acute confusion.  1.  Acute confusion: Could be related to dehydration versus underlying infection versus acute delirium or worsening dementia. [facility] and [facility] and presyncopal. Improved here without any intervention so will admit, start intravenous fluids, neuro checks. CT of the head with no acute changes but does have some chronic changes, especially bilateral frontal lobes, which could be possible Lewy body dementia or underlying vascular dementia., so will get an MRI of his brain. Urinalysis is negative  for infection. Blood cultures are pending. Chest x-ray showing no infiltrate. Since he does not have a fever or WBC elevation, will hold off on antibiotics at this time. Continue to monitor .  2.  Hypertension: Hold medications today due to hypotensive and restart with some holding parameters when able to. Continue to monitor.  3.  Coronary artery disease, status post stent: Appears stable. Continue home medications.  4.  Stage IV chronic kidney disease: Creatinine is at baseline. Continue to monitor.  5.  Benign prostatic hypertrophy: Continue Flomax.  6.  Gastroesophageal reflux disease: Continue Protonix.  7.  Deep vein thrombosis  prophylaxis: On subcutaneous heparin.   CODE STATUS: Full code.  TIME SPENT ON ADMISSION: 50 minutes.   ____________________________ Enid Baasadhika Minal Stuller, MD rk:jm D: 05/07/2012 15:50:01 ET T: 05/07/2012 16:47:08 ET JOB#: [id number]  cc: Enid Baasadhika Rjay Revolorio, MD, <Dictator> Duane LopeJeffrey D. Judithann SheenSparks, MD Enid BaasADHIKA Pinchos Topel MD ELECTRONICALLY SIGNED 05/31/2012 15:39

## 2014-06-05 NOTE — Consult Note (Signed)
   Comments   Spoke with pt's daughter, Bobby RumpfJoye, who is also his HCPOA. She confirms that patient has been chronically confused since his SDH. Daughter says that the impact from his initial fall was to the rear/side of the head with subsequent skull fracture and bleeding from ear. She recalls [facility] and was told there was also frontal lobe involvement. [facility]  recognizes that pt's mental status may not return to his previous baseline. She also understands that patient will likely need placement or 24 hour caregivers from this point forward. She hopes that [facility] and be transitioned to L[facility] but if this is not an option then family may opt for him to return home with 24 hour caregivers.  is in agreement with current medical plan and appreciates neurology referral.  talked about code status. This [facility] and family had hoped to have a conversation with patient if his confusion ever cleared. Daughter now recognizes that family will likely have to make a decision. She thinks that family will opt for DNR but needs to speak with her siblings.  followup.  20 minutes  Electronic Signatures: Ridhaan Dreibelbis, Daryl EasternJoshua R (NP)  (Signed 27-Mar-14 15:20)  Authored: Palliative Care   Last Updated: 27-Mar-14 15:20 by Malachy MoanBorders, Gabryella Murfin R (NP)

## 2014-06-05 NOTE — Consult Note (Signed)
   Comments   Called and spoke with pt's wife. She was unable to hear me and put her CNA on the phone. Unfortunately, her CNA was unable to provide me with any information regarding [name] [name]'s baseline status and advised that I call his daughter, [name] [name].  called and spoke with pt's daughter [name] [name]. She quickly informed me that her sister, [name] [name], is pt's HCPOA and any decisions would be made by her. [name] [name] did tell me that patient has had waxing and waning confusion since in SDH in 02/2012. She says that family has recognized that patient has not been doing well. We briefly discussed his disposition. Family likely intends for [facility] and had hoped that he may make it home with 24 hour care. However, family had instituted 24 hour sitters in the home the week prior to his last fall. [name] [name] says that family has talked about placement.  called and left a message for [name] [name] - pt's daughter and HCPOA. Will try to reach her tomorrow to clarify goals/code status.  20 minutes  Electronic Signatures for Addendum Section:  Phifer, Harriett SineNancy (MD) (Signed Addendum 26-Mar-14 16:59)  Discussed with Elouise MunroeJosh Krystle Oberman, NP, in detail. Agree with assessment and plan as outlined in above note.   Electronic Signatures: Jaron Czarnecki, Daryl EasternJoshua R (NP)  (Signed 26-Mar-14 16:29)  Authored: Palliative Care   Last Updated: 26-Mar-14 16:59 by Phifer, Harriett SineNancy (MD)

## 2014-06-05 NOTE — Discharge Summary (Signed)
PATIENT NAME:  [name] [name], [name] [name] MR#:  [id number] DATE OF BIRTH:  16-Mar-1920  DATE OF ADMISSION:  04/12/2012 DATE OF DISCHARGE:  04/18/2012  TYPE OF DISCHARGE: The patient is transferred to skilled nursing facility.   REASON FOR ADMISSION: Fall with traumatic hip injury.   HISTORY OF PRESENT ILLNESS: The patient is an 81 year old male with a history of stage IV chronic kidney disease, chronic anemia, as well as underlying coronary artery disease and peripheral vascular disease who fell prior to admission. He was found to have a hip fracture and was admitted for further evaluation.   PAST MEDICAL HISTORY: 1. ASCVD status post PTCA with stent placement.  2.  Peripheral vascular disease status post right carotid endarterectomy.  3.  Stage IV chronic kidney disease.  4.  Benign hypertension.  5.  Hyperlipidemia.  6.  BPH.  7.  Diverticulosis.  8.  Recurrent esophagitis.  9.  Hyperuricemia.  10.  Status post bilateral inguinal hernia repair.   MEDICATIONS ON ADMISSION: Please see admission note.   ALLERGIES:  FLAGYL.   SOCIAL HISTORY: Negative for alcohol or tobacco abuse.   FAMILY HISTORY: Positive for coronary artery disease.   REVIEW OF SYSTEMS:  As per admission note.   PHYSICAL EXAM: The patient was in no acute distress. Vital signs were stable and he was afebrile. HEENT exam was unremarkable. Neck was supple without JVD. Lungs were clear. Cardiac exam revealed a regular rate and rhythm with normal S1 and S2. Abdomen was soft and nontender. Extremities were without edema. Neurologic exam was grossly nonfocal.   HOSPITAL COURSE: The patient was admitted with a left hip fracture with underlying heart and renal disease. He was seen in consultation by orthopedics. He underwent left hip surgery with Dr. Mack Guise. He tolerated the procedure well. Physical therapy was started postop. The patient remained stable. He was seen in consultation by nephrology with no further adjustments needed to  his medications. His bowels moved and his pain was under good control. He is now transferred to the skilled nursing facility for further care and rehabilitation.   DISCHARGE DIAGNOSES: 1.  Left hip fracture status post surgical repair.  2.  Anemia of chronic disease.  3.  Stage IV chronic kidney disease.  4.  Benign hypertension.  5. Atherosclerotic cardiovascular disease status post percutaneous transluminal coronary angioplasty with stent placement.  6.  Peripheral vascular disease, status post right carotid endarterectomy.  7.  Glaucoma.   DISCHARGE MEDICATIONS: 1.  Norco 5/325 mg 1 to 2 p.o. q. 4 hours p.r.n. pain.  2.  Aspirin 325 mg p.o. daily.  3.  Betoptic eyedrops as directed.  4.  Alphagan P eyedrops as directed.  5.  Os-Cal p.o. twice a day.  6.  Plavix 75 mg p.o. daily.  7.  Dexamethasone eyedrops as directed.  8.  Surfak 240 mg p.o. at bedtime.  9.  Iron sulfate 325 mg p.o. b.i.d.  10.  Flonase 2 puffs in each nostril at bedtime.  11.  Imdur 20 mg p.o. daily.  12.  Milk of magnesia 30 mL p.o. q. 12 hours p.r.n. constipation.  13.  Lopressor 50 mg p.o. q. 12 hours.  14.  Protonix 40 mg p.o. daily.  15.  Zocor 10 mg p.o. at bedtime.  16.  Flomax 0.4 mg p.o. daily.  17.  Dulcolax suppositories 10 mg 1 per rectum q. 12 hours p.r.n. constipation.  18.  MiraLax 17 grams p.o. at bedtime.  19.  Combivent Respimat 1 puff q.i.d.  FOLLOW-UP PLANS AND APPOINTMENTS: The patient is a FULL CODE. He will be followed by the resident physician at the skilled nursing facility. He is on a 2 gram sodium diet.  He will wear knee-high TED hose daily, taken off at bedtime. He will be seen in consultation by physical therapy. Will obtain a CBC and a MET-B in 1 week.  ____________________________ Leonie Douglas. Doy Hutching, MD jds:sb D: 04/18/2012 07:10:04 ET T: 04/18/2012 07:24:53 ET JOB#: [id number]  cc: Leonie Douglas. Doy Hutching, MD, <Dictator> JEFFREY Lennice Sites MD ELECTRONICALLY SIGNED 04/18/2012 9:53

## 2014-06-05 NOTE — Consult Note (Signed)
Brief Consult Note: Diagnosis: Left hip fracture.   Patient was seen by consultant.   Recommend to proceed with surgery or procedure.   Recommend further assessment or treatment.   Orders entered.   Discussed with Attending MD.   Comments: Dr. Sampson GoonFitzgerald called me regarding this consult.  Patient sustained a mechanical fall.  [facility].  Patient reportedly has a left femoral neck hip fracture.  [facility] are not available for me to view.  I am [facility] so I can evaluate the injury and decide the appropriate surgical management.  I have put in general hip fracture orders in addition to the admission orders by Dr. Sampson GoonFitzgerald.  Patient has a history of coronary artery disease and chronic renal failure.  His creatinine is nearly 3 on admission labs.  He takes ASA and plavix for CAD with a history of stenting.  The family explains that he was just discharged after a rehab stay after a fall he had weeks ago with intracranial bleeding.  I will await for medical clearance by Dr. Sampson GoonFitzgerald and Dr. Lady GaryFath.  The risks and benefits of surgical intervention were discussed in detail with the patient's family.  I explained to the family the risks which  include, but are not limited to: infection, bleeding requiring transfusion, nerve and blood vessel injury, MI, stroke, respiratory failur, pneumonia,  DVT, and PE and death.  The family will consider whether they wish to proceed with surgery.  I will speak with them more tomorrow after I have reviewed the patient's radiographs and have a better idea of the fracture and the optimal surgical treatment.  Electronic Signatures for Addendum Section:  Juanell FairlyKrasinski, Tasmine Hipwell (MD) (Signed Addendum [id number]-Feb-14 18:27)  Examination of the patient demonstrates a right lower extremity which appears shortened and externally rotated compared to the left side.  There is a gauze bandage on the right heel for  treatment of a pressure ulcer on that side.  Patient has intact sensation to light touch in both feet with palpable pedal pulses.  He can flex and extend his toes and ankles bilaterally.  The skin overlying the right hip is intact without significant swelling or ecchymosis.  Patient is currently comfortable and does not complain of right hip pain.   Electronic Signatures: Juanell FairlyKrasinski, Vaun Hyndman (MD)  (Signed 28-Feb-14 17:53)  Authored: Brief Consult Note   Last Updated: 28-Feb-14 18:27 by Juanell FairlyKrasinski, Jannett Schmall (MD)

## 2014-06-05 NOTE — H&P (Signed)
PATIENT NAME:  [name] [name], [name] [name] MR#:  [id number] DATE OF BIRTH:  1921/01/19  DATE OF ADMISSION:  04/12/2012  PRIMARY CARE PHYSICIAN: Aram Beecham, MD.   REASON FOR ADMISSION: Hip fracture.   HISTORY OF PRESENT ILLNESS: This is a pleasant 81 year old gentleman with multiple medical problems who was at the wound care center been evaluated for a chronic right heel ulcer that will not heal. He had a mechanical fall when he was walking with his walker. He presented to urgent care and was found to have a left femoral neck fracture. He is being admitted for stabilization of medical problems prior to surgery.   The patient reports that he did not have any syncopal symptoms, including no palpitations, dizziness, lightheadedness before the fall. He had recently seen Dr. Lady Gary on February 26. He had been having low blood pressures, so his isosorbide and hydralazine had been discontinued. Amlodipine had also been stopped and he remained only on metoprolol. Today the patient is here with his son, who is a Emergency planning/management officer. He denies any chest pain, shortness of breath or dyspnea on exertion, although he is very limited in his mobility. He does have a nonhealing ulcer on his right heel.   PAST MEDICAL HISTORY:  1.  CAD with PCA and stent placement.  2.  Peripheral vascular disease, status post right carotid endarterectomy.  3.  Chronic kidney disease with baseline creatinine of 1.9 to 3.0. Most recently checked 3 days ago and it was 3.0. This is complicated by intermittent hyperkalemia; however, most recent potassium level was 5.1.  4.  Hypertension.  5.  Hyperlipidemia.  6.  BPH.  7.  Diverticulosis.  8.  Recurrent esophagitis.  9.  Hyperuricemia.   PAST SURGICAL HISTORY:  1.  PTCA with stent placement.  2.  Right carotid endarterectomy.  3.  Status post stenting of his legs.  4.  Status post renal stent.  5.  Status post bilateral inguinal hernia repair.   SOCIAL HISTORY: The patient is here with his  son today. His daughter is the power of attorney. As of now he is full code, as this has not been addressed, but the son does say he has a living will. The patient denies any tobacco or alcohol use.   FAMILY HISTORY: His brother had coronary artery disease. His mother and father also had coronary artery disease.   MEDICATIONS: The patient currently appears to be on: 1.  Aspirin 81 once a day.  2.  Plavix 75 once a day.  3.  Colchicine 0.6 twice a day as needed.  4.  Hydrochlorothiazide 25 once a day.  5.  Vicodin p.r.n.  6.  Nexium.  7.  Oxycodone p.r.n.  8.  Protonix 40 once a day.  9.  Simvastatin 10 once a day.  10.  Kayexalate p.r.n.  11.  Flomax 0.4 once a day.   ALLERGIES: THE PATIENT IS LISTED AS BEING ALLERGIC TO FLAGYL.   PHYSICAL EXAMINATION:  GENERAL: The patient is sitting in a wheelchair in some distress. He is very hard of hearing.  VITAL SIGNS: Pulse 72, blood pressure 136/62, temperature 97.8, sat 98% on room air.  HEENT: Pupils equal, round, reactive to light and accommodation. Extraocular movements are intact. Sclerae anicteric. Oropharynx is clear.  NECK: Supple.  HEART: Regular with distant heart sounds.  LUNGS: Clear to auscultation bilaterally.  ABDOMEN: Soft, nondistended, nontender. No hepatosplenomegaly.  EXTREMITIES: His left leg is held in an awkward, internally rotated position sitting in the wheelchair. He  has 1+ edema bilaterally.  NEUROLOGIC: He is very hard of hearing. Difficult to assess his alertness and orientation.   LABORATORY, DIAGNOSTIC AND RADIOLOGICAL DATA: There is no blood work available from today. Blood work on February 26: White count 8.3, hemoglobin 9.5, platelets 323. Of note, his hemoglobin prior check in November was 10.5 and prior to that was 9.5, so it is a chronic anemia. Renal function: Creatinine 3.0, BUN 27. That creatinine is around baseline. His estimated GFR is 20. Albumin is 3.4, alkaline phosphatase 117, total bilirubin 0.4.  Calcium 8.9, bicarbonate 23.9, glucose 90, potassium 5.1, protein 6.4. ALT, AST were normal. Sodium 141. Per report, x-ray done today shows a left femoral neck fracture. Chest x-ray and EKG are pending.   IMPRESSION: This is a pleasant 81 year old gentleman with multiple medical problems who fell and broke his left femoral neck. This was a mechanical fall and there is no concern for syncope or orthostasis.   PLAN:  1.  We will admit the patient to medicine. Put him on telemetry and attempt to stabilize him for surgery.  2.  We will consult orthopedics.  3.  I have consulted cardiology given his cardiac history and recent changes in his medications. I do not see that he is currently on a beta blocker, but he may benefit from this. We will review this with the family and with cardiology.  4.  Renal insufficiency: We will continue to monitor this closely. Will avoid nephrotoxins.  5.  Pain control: We will place him on oxycodone p.r.n.  6.  GI: We will continue him on his outpatient pantoprazole.  7.  Gout: We will monitor for any flares and can use colchicine as needed.  8.  The patient is full code. I discussed this with his son, who is with him today. Apparently the daughter is the power of attorney and we will confirm this at that time.   TIME SPENT: This admission took 45 minutes.   ____________________________ Stann Mainlandavid P. Sampson GoonFitzgerald, MD dpf:jm D: 04/12/2012 16:35:45 ET T: 04/12/2012 17:23:00 ET JOB#: [id number]  cc: Stann Mainlandavid P. Sampson GoonFitzgerald, MD, <Dictator> Kandice Schmelter Sampson GoonFITZGERALD MD ELECTRONICALLY SIGNED 04/17/2012 19:27

## 2014-06-05 NOTE — Consult Note (Signed)
Referring Physician:  Idelle Crouch :   Primary Care Physician:  Darol Destine, [address], [place], [place] [address], [place] [phone]  Reason for Consult: Admit Date: 08-May-2012  Chief Complaint: confusion  Reason for Consult: confusion   History of Present Illness: History of Present Illness:   81 yo RHD M who was normally able to take care of his own ADLs until 3 weeks ago when he had a fall in which he broke his hip and had a SDH.  Since that time, he never returned to his baseline.  Yesterday he had a period of increased confusion so he was brought to hospital and CT showed some changes so neurology was consulted.  ROS:  Review of Systems   unobtainable secondary to mental status  Past Medical/Surgical Hx:  Gout:   Septic Left Knee:   H/O Liver Abscesses:   Anemia:   Chronic Hyponatremia:   Glaucoma:   HOH / Hearing Aids:   Colonic polyps:   Left knee septic arthritis:   Diverticulosis:   BPH:   Hypertension:   Hyperlipidemia:   History of chronic hypokalemia:   Stage IV Chronic Kidney Disease:   Right Carotid Endarectomy:   Cardiac Stents X 3:   Bilateral inguinal hernia repair:   Periopheral Vascular Disease: status post two stents to kidneys, three stents to legs, and right carotid endarterectomy  Atherosclerotic Cardiovascular Disease: two stents placed and angioplasty  Past Medical/ Surgical Hx:  Past Medical History as above   Past Surgical History as above   Home Medications: Medication Instructions Last Modified Date/Time  dexamethasone ophthalmic 0.1% ophthalmic solution 1 drop(s) to each eye once a day (at bedtime) for glaucoma (2100) **wait 5-10 minutes between eye drops** 25-Mar-14 12:53  isosorbide mononitrate 30 mg oral tablet, extended release 1 tab(s) orally once a day for heart (0800) 25-Mar-14 12:53  aspirin 325 mg oral delayed release tablet 1 tab(s) orally once a day for anticoagulation (0800) 25-Mar-14 12:53   simvastatin 10 mg oral tablet 1 tab(s) orally once a day (at bedtime) for hyperlipidemia (2100) 25-Mar-14 12:53  docusate calcium 240 mg oral capsule 1 cap(s) orally once a day (at bedtime) as a stool softener (2100) 25-Mar-14 12:53  Bisac-Evac 10 mg rectal suppository 1 suppository(ies) rectal every 12 hours, As Needed - for Constipation 25-Mar-14 12:53  acetaminophen-hydrocodone 325 mg-5 mg oral tablet 1-2 tab(s) orally every 4 hours, As Needed - for Pain 25-Mar-14 12:53  Milk of Magnesia 8% oral suspension 30 milliliter(s) orally every 12 hours, As Needed - for Constipation 25-Mar-14 12:53  Coreg 12.5 mg oral tablet 1 tab(s) orally 2 times a day (0800, 1700) 25-Mar-14 12:53  Betoptic S 0.25% ophthalmic suspension 1 drop(s) to each eye once a day (at bedtime) for glaucoma (2100) 25-Mar-14 12:53  brimonidine ophthalmic 0.15% ophthalmic solution 1 drop(s) to each eye once a day (at bedtime) for glaucoma (2100) **wait 5-10 minutes between drops** 25-Mar-14 12:53  clopidogrel 75 mg oral tablet 1 tab(s) orally once a day as an anticoagulant (0800) 25-Mar-14 12:53  Combivent Respimat CFC free 100 mcg-20 mcg/inh inhalation aerosol 1 puff(s) inhaled 4 times a day for shortness of breath (0800, 1300, 1700, 2100) 25-Mar-14 12:53  ferrous sulfate 325 mg (65 mg elemental iron) oral tablet 1 tab(s) orally 2 times a day for anemia (1200, 1700) 25-Mar-14 12:53  fluticasone nasal 50 mcg/inh nasal spray 2 spray(s) nasal once a day (at bedtime) for rhinitis (2100) 25-Mar-14 12:53  MiraLax - oral  powder for reconstitution 1 packet(s) dissolved in 8 ounces of liquid orally once a day (at bedtime) for constipation (2100) 25-Mar-14 12:53  Oyster Shell Calcium with Vitamin D 500 mg-200 intl units oral tablet 1 tab(s) orally 2 times a day as a calcium and daily vitamin supplement (0800, 2000) 25-Mar-14 12:53  pantoprazole 40 mg oral delayed release tablet 1 tab(s) orally once a day for esophagitis (0600) 25-Mar-14 12:53   tamsulosin 0.4 mg oral capsule 1 cap(s) orally once a day for BPH (0900) 25-Mar-14 12:53  Vitamin D3 1000 intl units oral capsule 1 cap(s) orally once a day (0800) 25-Mar-14 12:53  quetiapine 25 mg oral tablet 1 tab(s) orally once a day (at bedtime) (2100) 25-Mar-14 12:53   Allergies:  Flagyl: Unknown  Social/Family History: Employment Status: disabled  Lives With: alone  Living Arrangements: residential facility  Social History: no tob, no EtOH, no illicits  Family History: n/c   Vital Signs: **Vital Signs.:   27-Mar-14 13:41  Vital Signs Type Routine  Temperature Temperature (F) 98.2  Celsius 36.7  Temperature Source oral  Pulse Pulse 84  Respirations Respirations 17  Systolic BP Systolic BP 659  Diastolic BP (mmHg) Diastolic BP (mmHg) 70  Mean BP 95  Pulse Ox % Pulse Ox % 97  Pulse Ox Activity Level  At rest  Oxygen Delivery Room Air/ 21 %   Physical Exam: General: no acute distress, appropiate weight, well kept  HEENT: normocephalic, sclera nonicteric, oropharynx clear  Neck: supple, no JVD, no bruits  Chest: CTA B, no wheezing, good movement  Cardiac: RRR, no murmurs, no edema, 2+ pulses  Extremities: no C/C/E, FROM   Neurologic Exam: Mental Status: alert but oriented only to name, can not name or repeat;  does not follow commands,  mild echolachia  Cranial Nerves: PERRLA, EOMI, nl VF to threat, face symmetric, tongue midline, shoulder shrug equal  Motor Exam: moves all 4 ext equally, increased tone  Deep Tendon Reflexes: 1+/4 B, downgoing Plantars, + Moro and routing reflexes  Sensory Exam: localizes quickly to pain  Coordination: untestable   Lab Results: Thyroid:  25-Mar-14 12:17   Thyroid Stimulating Hormone  9.61 (0.45-4.50 (International Unit)  ----------------------- Pregnant patients have  different reference  ranges for TSH:  - - - - - - - - - -  Pregnant, first trimetser:  0.36 - 2.50 uIU/mL)    20:25   Thyroxine, Free 0.92 (Result(s)  reported on 07 May 2012 at 09:30PM.)  Hepatic:  25-Mar-14 12:17   Bilirubin, Total 0.4  Alkaline Phosphatase  191  SGPT (ALT) 14  SGOT (AST) 18  Total Protein, Serum 6.9  Albumin, Serum  2.9  Routine Micro:  25-Mar-14 20:35   Micro Text Report BLOOD CULTURE   COMMENT                   NO GROWTH IN 36 HOURS   ANTIBIOTIC                       Culture Comment NO GROWTH IN 36 HOURS  Result(s) reported on 09 May 2012 at 08:49AM.  Routine Chem:  27-Mar-14 04:49   Glucose, Serum 68  BUN  34  Creatinine (comp)  2.45  Sodium, Serum 137  Potassium, Serum 4.5  Chloride, Serum 103  CO2, Serum 27  Calcium (Total), Serum 9.1  Anion Gap 7  Osmolality (calc) 280  eGFR (African American)  26  eGFR (Non-African American)  22 (eGFR values <  26m/min/1.73 m2 may be an indication of chronic kidney disease (CKD). Calculated eGFR is useful in patients with stable renal function. The eGFR calculation will not be reliable in acutely ill patients when serum creatinine is changing rapidly. It is not useful in  patients on dialysis. The eGFR calculation may not be applicable to patients at the low and high extremes of body sizes, pregnant women, and vegetarians.)  Cardiac:  26-Mar-14 04:25   Troponin I 0.02 (0.00-0.05 0.05 ng/mL or less: NEGATIVE  Repeat testing in 3-6 hrs  if clinically indicated. >0.05 ng/mL: POTENTIAL  MYOCARDIAL INJURY. Repeat  testing in 3-6 hrs if  clinically indicated. NOTE: An increase or decrease  of 30% or more on serial  testing suggests a  clinically important change)  CK, Total  28  CPK-MB, Serum 1.2 (Result(s) reported on 08 May 2012 at 05:28AM.)  Routine UA:  25-Mar-14 14:04   Color (UA) Yellow  Clarity (UA) Cloudy  Glucose (UA) Negative  Bilirubin (UA) Negative  Ketones (UA) Negative  Specific Gravity (UA) 1.015  Blood (UA) Negative  pH (UA) 6.0  Protein (UA) Negative  Nitrite (UA) Negative  Leukocyte Esterase (UA) Negative (Result(s) reported on  07 May 2012 at 02:21PM.)  RBC (UA) 1 /HPF  WBC (UA) 1 /HPF  Bacteria (UA) NONE SEEN  Epithelial Cells (UA) <1 /HPF (Result(s) reported on 07 May 2012 at 02:21PM.)  Routine Coag:  25-Mar-14 12:17   Prothrombin 13.7  INR 1.0 (INR reference interval applies to patients on anticoagulant therapy. A single INR therapeutic range for coumarins is not optimal for all indications; however, the suggested range for most indications is 2.0 - 3.0. Exceptions to the INR Reference Range may include: Prosthetic heart valves, acute myocardial infarction, prevention of myocardial infarction, and combinations of aspirin and anticoagulant. The need for a higher or lower target INR must be assessed individually. Reference: The Pharmacology and Management of the Vitamin K  antagonists: the seventh ACCP Conference on Antithrombotic and Thrombolytic Therapy. CQPRFF.6384Sept:126 (3suppl): [id number] A HCT value >55% may artifactually increase the PT.  In one study,  the increase was an average of 25%. Reference:  "Effect on Routine and Special Coagulation Testing Values of Citrate Anticoagulant Adjustment in Patients with High HCT Values." American Journal of Clinical Pathology 2006;126:400-405.)  Routine Hem:  27-Mar-14 04:49   WBC (CBC) 9.3  RBC (CBC)  3.59  Hemoglobin (CBC)  10.6  Hematocrit (CBC)  32.4  Platelet Count (CBC) 264  MCV 90  MCH 29.6  MCHC 32.8  RDW  16.5  Neutrophil % 82.0  Lymphocyte % 9.7  Monocyte % 6.5  Eosinophil % 1.4  Basophil % 0.4  Neutrophil #  7.6  Lymphocyte #  0.9  Monocyte # 0.6  Eosinophil # 0.1  Basophil # 0.0 (Result(s) reported on 09 May 2012 at 0[facility])   Radiology Results: [place]    26-Mar-14 13:48, [place] Doppler Bilateral  [place] Doppler Bilateral   REASON FOR EXAM:    AMS< prior carotid artery stenosis  COMMENTS:       PROCEDURE: [place] - [place]OTID DOPPLER BILATERAL  - May 08 2012  1:48PM     RESULT:     Technique: Grayscale, color flow  duplex Doppler, and spectral waveform   imaging was performed of the right and left carotid systems.    Findings: Visual evaluation of the right carotid system demonstrates   areas of mild calcified plaque within the common carotid artery, carotid  bulb and internal carotid artery demonstrating less than 50% visual   stenosis. The left system demonstrates mixed calcified and soft plaque   within the common carotid artery demonstrating less than 50% stenosis and     calcified plaque within the carotid bulb and internal carotid artery   demonstrating areas of visual stenosis of approximately 50 to 75%.    ICA/CCA ratios:    RIGHT:  0.76    LEFT:  2.01    Elevated velocities are identified within the left mid internal carotid   artery as well as within the proximal left internal carotid artery as   well as the left external carotid artery.    Color flow demonstrates areas of incomplete filling within the left mid   internal carotid artery and within the external carotid artery. These     areas correspond to regions of mural plaque. Spectral waveform imaging is   unremarkable within the right and left carotid systems.    IMPRESSION: Findings concerning for hemodynamically significant stenosis   within the left mid internal carotid artery of the magnitude of   approximately 74%. Vascular interventional consultation is recommended.      Thank you for this opportunity to contribute to the care of your patient.         Verified By: Mikki Santee, M.D., MD  CT:    25-Mar-14 12:55, CT Head Without Contrast  CT Head Without Contrast   REASON FOR EXAM:    altered mental status  COMMENTS:       PROCEDURE: CT  - CT HEAD WITHOUT CONTRAST  - May 07 2012 12:55PM     RESULT: Head CT dated 05/07/2012 comparison made to prior study dated   06/11/2010.    Technique: Helical noncontrasted 5 mmsections were obtained from the   skull base to the vertex.    Findings: Area of  low-attenuation project within the right frontal region   along the anterior periphery and an MCA and watershed distribution. Has   the appearance of a region of chronicinfarction. There does not appear   to be associated mass effect to suggest an underlying mass or nodule.     Findings also appreciated medially within the base on the left likely   representing an ACA distribution. There is no evidence of acute   hemorrhage no evidence of intra-axial nor extra-axial fluid collections   nor mass effect. There is diffuse cortical atrophy, mild. Mild areas of   low-attenuation project within the subcortical, deep, and periventricular   white matter regions. There is no evidence of a depressed skull fracture.   The mastoid air cells and visualized paranasal sinuses are patent.    IMPRESSION:  Findings like representing areas of chronic infarction   within the right and left frontal lobes.  2. Otherwise involutional changes without evidence of acute abnormalities.        Verified By: Mikki Santee, M.D., MD    26-Mar-14 12:43, CT Head Without Contrast  CT Head Without Contrast   REASON FOR EXAM:    alterd mental status  COMMENTS:       PROCEDURE: CT  - CT HEAD WITHOUT CONTRAST  - May 08 2012 12:43PM     RESULT: Comparison:  05/07/2012, 06/11/2010    Technique: Multiple axial images from the foramen magnum to the vertex   were obtained without IV contrast.    Findings:    There are focal areas of hypoattenuation involving the inferior aspect of  the bilateral frontal lobes. There appears to be some effacement of the   gray-white junction. These are nonspecific, but possibilities would   include age-indeterminate infarcts as well as contusions given the recent     history of trauma. There are some peripheral areas of hyperdensity in   these regions which could be artifactual secondary to the adjacent   cortex, but superimposed hemorrhage cannot be excluded. No midline shift.    Mild prominence of the ventricular system is likely due to central   atrophy.    There is opacification of the left mastoid air cells, which is   nonspecific. Correlate for mastoiditis. There is a small amount of fluid   in the right mastoid air cells.    IMPRESSION:    There are areas of hypoattenuation involving the inferior frontal lobes,   right greater than left. These are of uncertain etiology, but possibly   age-indeterminate infarcts or potentially contusions given the history of   trauma. There is some hyperattenuation within this regions which raises     the possibility of superimposed hemorrhage. Further evaluation could be   provided with MRI. At a minimum, followup head CTis recommended in 12   hours.    This was discussed with Dr. Fulton Reek at 1306 hours 05/08/2012.        Verified By: Gregor Hams, M.D., MD    27-Mar-14 09:05, CT Head Without Contrast  CT Head Without Contrast   REASON FOR EXAM:    AMS, ? bleed  COMMENTS:       PROCEDURE: CT  - CT HEAD WITHOUT CONTRAST  - May 09 2012  9:05AM     RESULT: History: Altered mental status.    Comparison Study: Prior CT 05/08/2012.    Findings: No mass. No hydrocephalus. Encephalomalacia right frontal lobe.   Old lacunar infarcts.. Postsurgical changes noted of the left globe. Mild   ventriculomegaly . This is consistent atrophy. Paranasal sinuses are   clear.    IMPRESSION:  Chronic ischemic change. No acute abnormality .    Verified By: Osa Craver, M.D., MD   Radiology Impression: Radiology Impression: CT of head personally reviewed by me and shows dilated ventricles when compared to previous CT in 2012.  There is also new R frontal encephalomalacia noted and mild L.   Impression/Recommendations: Recommendations:   previous notes reviewed by me reviewed by me and abnormal TSH   Encephalopathy-  etiology is unknown as of now but this is severe;  doubt underlying neurodegerative d/o due to  abrupt onset time.  High concern for subclinical seizures with abnormal CT.  Also could be hydrocephalus as well from old hemorrhage causing symptoms.  Abnormal TSH could contribute so will look for Hashimotos encephalopathy as well. agree with MRI when possible EEG ordered give trial load of Dilantin 1gm IV then 16m q8h to see if improvement seroquel 12.562mqHS at night for agitation lights on during day and to chair as tolerated during the day will chech ESR, CRP, thyroglobulin Ab, RPR will follow  Electronic Signatures: SmJamison NeighborMD)  (Signed 27-Mar-14 16:13)  Authored: REFERRING PHYSICIAN, Primary Care Physician, Consult, History of Present Illness, Review of Systems, PAST MEDICAL/SURGICAL HISTORY, HOME MEDICATIONS, ALLERGIES, Social/Family History, NURSING VITAL SIGNS, Physical Exam-, LAB RESULTS, RADIOLOGY RESULTS, Recommendations   Last Updated: 27-Mar-14 16:13 by SmJamison NeighborMD)

## 2014-06-05 NOTE — Consult Note (Signed)
General Aspect Pt is a 81 yo male with history of cad followed by Dr. Darrold JunkerParaschos with history  of a ptca in 1993, pci of om1 8/98, chronically occluded rca with a cypher stent in the om1 in 4/05. He also has hypertension and hyperlipidemia and vasodepressor syncope history . He was admitted in 6/13 with hyponatremia. He is now admitted after a mechanical fall resulting in a right hip fracture. He is a very difficult historian. He apparently is not having any chest pain. His baseline ekg is normal and he is hemodynamically stable. His serum creatinine was 3.0 on admission. Currently it is 2.64. He has had intermitant acute on chronic renal insuffiency in the past admissions. Apparently is eating and drinking well at home.   Physical Exam:  GEN well nourished, no acute distress   HEENT very hard of hearing   RESP normal resp effort  no use of accessory muscles   CARD Regular rate and rhythm  Normal, S1, S2  Murmur   Murmur Systolic   Systolic Murmur axilla   ABD denies tenderness  normal BS   LYMPH negative neck, negative axillae   EXTR negative cyanosis/clubbing, negative edema   SKIN normal to palpation   NEURO cranial nerves intact, motor/sensory function intact   PSYCH poor insight   Review of Systems:  ROS Pt not able to provide ROS   Medications/Allergies Reviewed Medications/Allergies reviewed   Home Medications: Medication Instructions Status  clopidogrel 75 mg oral tablet 1 tab(s) orally once a day (in the morning) Active  pantoprazole 40 mg oral granule, enteric coated 1 each orally once a day (in the morning) Active  tamsulosin 0.4 mg oral capsule 1 cap(s) orally once a day (at bedtime) Active  Norco 5 mg-325 mg oral tablet 1 to 2 tab(s) orally every 4 hours, As Needed- for Pain  Active  Tylenol 325 mg oral tablet 2 tab(s) orally every 4 hours, As Needed- for Pain  Active  Centrum Silver Therapeutic Multiple Vitamins with Minerals oral tablet 1 tab(s) orally once a  day Active  metoprolol 100 mg oral tablet 1 tab(s) orally once a day Active  Alphagan 0.15% ophthalmic solution 1 drop(s) to each eye once a day (at bedtime) Active  Betoptic 0.25% ophthalmic suspension 1 drop(s) to each eye once a day (at bedtime) Active  dexamethasone ophthalmic 0.1% ophthalmic solution 1 drop(s) to each eye once a day (at bedtime) Active  Flonase 50 mcg/inh nasal spray 2 spray(s) each nare once a day (at bedtime) Active  MiraLax - oral powder for reconstitution 1 dose(s) orally once a day Active  simvastatin 10 mg oral tablet 1 tab(s) orally once a day (at bedtime) for cholesterol (2000) Active   EKG:  EKG Nml  NSR    Flagyl: Unknown   Impression 81 yo male with history of cad s/p pci of om1 and chronically occluded rca, actue on chronic renal insuffiency who was admitted with mechanical fall resulting in a hip fracture. He is hemodynamically stable and ekg is normal. He is on beta blockers and nitrates with no symtpoms. He is somewhat high risk for surgery given age, renal insuffiency history of cad etc, but appears to be optimized from cardiac standpoint for surgery. Would continue beta blocker throughout the periperative periiod.   Plan 1. Continue beta blockers and oral nitrates during the perioperative period. OK to hold asa and plavix perioperatively 2 Will review echo when available   Electronic Signatures: Dalia HeadingFath, Tevion Laforge A (MD)  (Signed  01-Mar-14 09:54)  Authored: General Aspect/Present Illness, History and Physical Exam, Review of System, Home Medications, EKG , Allergies, Impression/Plan   Last Updated: 01-Mar-14 09:54 by Dalia Heading (MD)

## 2014-06-05 NOTE — Consult Note (Signed)
PCP: Dr Dellis Filbert SparksNephrologist: Dr Kathrynn Ducking [facility])for consult: CKD, AoCKD  Mr Schalk is a 81 year old man with CKD4. He last saw Dr Lujean Rave on 11/07/11. At that time his Cr level was lower than baseline (it was 1.9 although was 2.9 in April 2013). He is now admitted s/o fall. Of note, the patient was admitted to [facility] in Jan 2014 after falling again. At that time, he suffered bifrontal SAH, rt temporal bone fx.  on day of consult, the patient is unable to answer questions appropriately. He is confused (oriented to self and place but not time) and laughs when questioned about his health and the events that led to his hospitalization. the chart, he was admitted on 04/12/12 s/p fall and suffered a femoral neck fracture. Of note, prior to admission, his BP's had been low and several of his antihypertensives were stopped. He is s/p left hip hemarthroplastly on 04/14/12. EBL was 250 mL. His Cr on admission was 2.9, then 2.6, then 2.3 and now currently 2.5. He had a TTE and CXR done on this hospitalization.  temporal bone fxrenal stentendarterectomy  NKDA (although flagyl is listed per the chart) not obtained not obtained reviewed in the computer vitals in computerelderly man, NADclear bilRRR, no rubsoft NTleft leg in a brace, no appreciable edemaoriented to self and place, not timelaughs at inappropriate times (ie during ROS) 04/15/12 noted3/1/13 noted  Cr 2.5 (eGFR 22), Hgb 65.19  81 year old man with CKD4 (baseline Cr around 2.2 but has been as high as 2.9 as outpt), now s/p left hip arthroplasty, recurrent falls CKD4- eGFR essentially stable. Patient is scheduled to see Dr Kathrynn Ducking on 05/07/12 at 10am and should keep this appt. Certainly has fluctuations in Cr level of unclear cause. Can stop IVF if taking adequate PO. NSAIDs and IV contrast as you are. Anemia- will order iron studies. If Hgb <9 tomorrow, then will order a dose of Epogen/Aranesp. Recurrent falls with complications- per  primary team HTN- stable. Avoid ACE/ARB/aldactone. BMM- can be checked as an outpt.  Electronic Signatures for Addendum Section: Trinda Pascal (MD)  (Signed Addendum 04-Mar-14 17:25) 6) Urinary retention- foley was removed earlier today but patient has not voided. I spoke with the nurse who will follow protocol and bladder scan. If patient has large residual in bladder, then I/O cath may be needed vs replacement of Foley. Patient has a h/o BPH.  Electronic Signatures: Trinda Pascal (MD) (Signed on 04-Mar-14 17:23)  Authored   Last Updated: 04-Mar-14 17:25 by Trinda Pascal (MD)

## 2014-06-05 NOTE — Op Note (Signed)
PATIENT NAME:  [name] SaundersRICHARD, Kameren MR#:  [id number] DATE OF BIRTH:  1920-09-14  DATE OF SURGERY:  04/14/2012  PREOPERATIVE DIAGNOSIS: Left femoral neck hip fracture.   POSTOPERATIVE DIAGNOSIS: Left femoral neck hip fracture.   PROCEDURE: Left hip hemiarthroplasty.   ANESTHESIA: General.  SURGEON: Dr. Juanell FairlyKevin Krasinski.   ESTIMATED BLOOD LOSS: 250 mL.   SPECIMENS: Femoral head to Pathology.   IMPLANTS: Stryker size 5 Accolade HFX stem, 54-mm Unitrax head component with a -4  Unitrax neck adjustment sleeve.   INDICATIONS FOR THE PROCEDURE: The patient is an 81 year old male who sustained a fall and was diagnosed with a left hip femoral neck fracture in the Emergency Room. Given that he is an ambulator at baseline, it was recommended that he undergo a left hip hemiarthroplasty.  I reviewed the risks and benefits of the surgery with the patient and his family. The family agreed to surgery after understanding that risks included, but are not limited to, infection, bleeding requiring blood transfusion, nerve or blood vessel injury, especially injury to the sciatic nerve leading to foot-drop, leg length discrepancy, change in lower extremity rotation, persistent left hip pain, painful hardware or failure of the hardware, and the need for further surgery including conversion to a total hip arthroplasty. Medical complications include, but are not limited to, DVT and pulmonary embolism, myocardial infarction, stroke, pneumonia, respiratory failure and death.   PROCEDURE NOTE: The patient was marked with the word "yes" over the left hip according to the hospital's right-site protocol. He was brought to the operating room where he underwent general anesthesia. He was placed in a right lateral decubitus position. All bony prominences were adequately padded, including padding around the right leg to protect the common peroneal nerve. The patient was prepped and draped in sterile fashion.   A time-out was  performed to verify the patient's name, date of birth, medical record number, correct side of surgery and correct procedure to be performed. It was also used to verify the patient received antibiotics and that all appropriate instruments, implants and radiographic studies were available in the room. Once all in attendance were in agreement, the case began.   A curvilinear incision over the left hip was made centered over the greater trochanter. Subcutaneous tissues were dissected with the electrocautery. All bleeding vessels were cauterized during exposure. The fascia lata was then identified and sharply split with a deep #10 blade. This revealed the underlying hip bursa. This was dissected off the external rotators. The external rotators were then released from their attachment to the posterior trochanter and reflected posteriorly to protect the sciatic nerve. A T-shaped capsulotomy was then performed. Both leaflets of the capsule were tagged for later repair. The external rotator, the piriformis and conjoined tendon were also tagged for later repair. The fracture was then easily identified. This was removed and measured. It was a size 54-mm head. Next, the attention was turned to the proximal femur. A proximal femoral osteotomy was performed just above the lesser trochanter. Two Cobra retractors were then placed in the acetabulum. The size 54 trial was then placed in the acetabulum and found to have excellent fit. The size 55 was too large.   The attention was then turned back to the femoral preparation. A hip-skid retractor was placed under the femoral neck for exposure. A box osteotome was used to create the initial entry point into the femur. A single hand reamer as well as the sounder for the femoral canal was then used to ensure that  the femoral canal had been entered.   Sequential broaches were then used for femoral cutout preparations beginning with a size 0. It was found that the patient had the best  medial lateral fit with a size 5 prosthesis. After the trial stem was in position the trial neck and head were then inserted onto the femoral neck prosthesis and the construct was reduced into the acetabulum. The patient had his leg length measured and a full range of motion was undertaken. The stability and leg lengths were found to be most equivalent with a -4 neck.   All trial components were then removed. The hip joint was then copiously irrigated with pulse lavage. The actual size 5 stem was then inserted into the femoral canal. Again, the trial components of the -4 and 54 outer head were then placed on the trunnion and reduced. Again, this was found to have excellent stability and range of motion. The actual size 54 Unitrax head with a -4 four neck adjustment sleeve were then inserted onto the trunnion of the size 5 Accolade HFX stem and reduced. Again, the hip joint was copiously irrigated.   The T-shaped capsulotomy was repaired using #2 Tycron. A soft tissue repair of the external rotators was performed. Again, the wound was copiously irrigated. The fascia lata was closed with a 0 interrupted Vicryl. The subcutaneous tissues were closed with 2-0 Vicryl and the skin approximated with staples. I was scrubbed and present for the entire case, and all sharp and instrument counts were correct at the conclusion of the case. The patient was rolled on his back for extubation. His leg lengths were equivalent. He has an abduction pillow and a knee immobilizer on the left lower extremity. I spoke with the patient's family postoperatively to let them know the case had gone without complication, and the patient was stable in the recovery room.     ____________________________ Kathreen Devoid, MD klk:dm D: 04/22/2012 08:03:00 ET T: 04/22/2012 08:56:18 ET JOB#: [id number]  cc: Kathreen Devoid, MD, <Dictator> Kathreen Devoid MD ELECTRONICALLY SIGNED 04/24/2012 12:48

## 2014-06-05 NOTE — Consult Note (Signed)
   Comments   Spoke with pt's daughter via phone. Updated her on pt's medical status and ongoing workup. She has spoken with several of her siblings about code status but she is still waiting to hear back from others. She asks that I call her back later this afternoon.  currently downstairs getting EEG. Not available in room for exam. Will return later.  15 minutes  Electronic Signatures for Addendum Section:  Phifer, Harriett SineNancy (MD) (Signed Addendum 28-Mar-14 21:49)  Discussed with Elouise MunroeJosh Rivky Clendenning, NP, in detail. Will follow up with daughter.   Electronic Signatures: Norena Bratton, Daryl EasternJoshua R (NP)  (Signed 28-Mar-14 09:16)  Authored: Palliative Care   Last Updated: 28-Mar-14 21:49 by Phifer, Harriett SineNancy (MD)

## 2014-06-07 NOTE — Consult Note (Signed)
Chief Complaint:   Subjective/Chief Complaint Pt seen/examined. Admitted with septic left knee. Denies CP or SOB. See dictation for details.   VITAL SIGNS/ANCILLARY NOTES: **Vital Signs.:   04-Jun-13 17:24   Temperature Temperature (F) 98.8   Pulse Pulse 91   Systolic BP Systolic BP 165   Diastolic BP (mmHg) Diastolic BP (mmHg) 73   Pulse Ox % Pulse Ox % 99   Oxygen Delivery Room Air/ 21 %   Brief Assessment:   Cardiac Regular    Respiratory clear BS    Gastrointestinal details normal Soft  Nontender  Bowel sounds normal   Assessment/Plan:  Assessment/Plan:   Plan Will add Lovenox. Check labs in AM. F/U on blood cultures. Will follow with you. Call if questions arise.   Electronic Signatures: Marguarite ArbourSparks, Hesper Venturella D (MD)  (Signed 04-Jun-13 18:41)  Authored: Chief Complaint, VITAL SIGNS/ANCILLARY NOTES, Brief Assessment, Assessment/Plan   Last Updated: 04-Jun-13 18:41 by Marguarite ArbourSparks, Kanyla Omeara D (MD)

## 2014-06-07 NOTE — H&P (Signed)
PATIENT NAME:  LAYLA, GRAMM MR#:  [id number] DATE OF BIRTH:  March 22, 1920  DATE OF ADMISSION:  08/08/2011  PRIMARY MD: Aram Beecham, MD   ED REFERRING DOCTOR: Dr. Mayford Knife   REASON FOR ADMISSION: Severe hyponatremia.   HISTORY OF PRESENT ILLNESS: The patient is an 81 year old white male who was actually hospitalized recently from June 4th to June 7th with septic left knee. His wound cultures grew out Staph but also was noted to have monosodium urate crystals. The patient was treated with IV antibiotics and was discharged to [facility]. He, since being at [facility] according to his son, has not been drinking as well as he should be and eating as well as he should be. He has had some confusion. According to the son, he states that his father sometimes repeats himself and then forgets things, which is new. He has not had any fevers or chills. Has not had any nausea, vomiting, or diarrhea. The patient was apparently on HCTZ and was taken off of this a few days ago when his sodium was noted to be low at the facility. Today they repeated his blood work and noticed his sodium to be 116 and his potassium to be low as well. He otherwise denies any chest pain, shortness of breath. His knee pain is significantly improved but still has some pain in the knee. He is still continued on p.o. antibiotics.   PAST MEDICAL HISTORY:  1. History of atherosclerotic cardiovascular disease, status post two stents as well as angioplasty.  2. Stage IV chronic kidney disease.  3. History of chronic hypokalemia.  4. Hypertension.  5. Peripheral vascular disease, status post two stents to the kidneys, three stents to the legs, status post right carotid endarterectomy.  6. Hyperlipidemia.  7. Status post bilateral inguinal hernia repair.  8. Benign prostatic hypertrophy.  9. Diverticulosis.  10. Recent hospitalization for left knee septic arthritis.  11. Gout.  12. Diverticulosis.  13. History of colonic polyps.   14. History of Peyronie's disease.  15. History of recurrent esophagitis.  16. Hyperuricemia.  17. History of liver apices.  18. History of previous pneumonia.  19. History of left eye cataract surgery.  20. History of detached retina in the left eye. 21. Glaucoma.   ALLERGIES: Flagyl.   CURRENT MEDICATIONS:  1. Acetaminophen/oxycodone 325/5 1 to 2 tabs q.4 p.r.n. pain.  2. Alphagan 0.15% one GTT to each eye b.i.d.  3. Amlodipine 10 daily.  4. Aspirin 81 mg 1 tab p.o. daily.  5. Betoptic S 0.25 ophthalmic solution one drop to each eye daily. 6. Bisac-Evac 10 mg rectal suppository daily as needed for constipation. 7. Brimonidine 0.15% ophthalmic solution one drop to each eye b.i.d.  8. Cerovite Senior 1 tab p.o. daily.  9. Plavix 75 p.o. daily.  10. Colcrys 0.6 1 tab p.o. daily.  11. Dorzolamide 2% ophthalmic solution one drop to each eye b.i.d.  12. Isosorbide mononitrate 60 daily.  13. Levaquin 500 1 tab p.o. daily.  14. Magnesium citrate once daily.  15. Metoprolol succinate 200 mg daily.  16. Milk of Magnesia at bedtime as needed for constipation. 17. Nexium 20 daily.  18. Zofran 4 mg q.4 p.r.n. for nausea.  19. Simvastatin 10 daily.  20. Sodium polystyrene sulfonate 10 mg orally every other day. 21. Flomax 0.4 daily.  22. Vitamin E 40,000 units daily.   SOCIAL HISTORY: Does not smoke. Does not drink. Currently resides at the rehab facility.   FAMILY HISTORY: Positive for coronary  artery disease.   REVIEW OF SYSTEMS: CONSTITUTIONAL: Denies any fevers, chills. No weight loss. No weight gain. Has generalized weakness. HEENT: Denies any blurred vision or erythema. Has glaucoma. History of cataracts. No drainage from the eyes. Denies any nasal drainage. No seasonal allergies. No epistaxis. Denies any difficulty with swallowing. Has chronic hearing difficulties. CARDIOVASCULAR: Has history of coronary artery disease. Denies any chest pains, palpitations, or syncope recently. No  edema. Has high blood pressure. PULMONARY: Denies any COPD, asthma. No hemoptysis. No wheezing. Denies any dyspnea on exertion. GI: Denies any nausea, vomiting. Has chronic constipation. No hematemesis. No hematochezia. No bright red blood per rectum. GU: Denies any frequency, urgency, or hesitancy. HEME/LYMPH: Denies any anemia or easy bruisability. SKIN: Denies any skin rash. No changes in mole. NEUROLOGIC: No numbness. No CVA, no TIA, or seizures. PSYCHIATRIC: No anxiety. No depression.   PHYSICAL EXAMINATION:   VITAL SIGNS IN THE ER: Temperature 98, pulse 82, respirations 18, blood pressure 154/67, O2 94%.   GENERAL: The patient is an elderly male, appears chronically weak, in no acute distress.   HEENT: Head atraumatic, normocephalic. Pupils equal, round, reactive to light and accommodation. There is no conjunctival pallor. Oropharynx is clear.   NECK: Supple without JVD. No adenopathy. No thyromegaly.   LUNGS: Clear to auscultation bilaterally without any rales, rhonchi, wheezing. No accessory muscle usage.   HEART: Regular rate and rhythm with S1, S2 positive. There is a systolic murmur noted. No rubs or gallops.   ABDOMEN: Soft, nontender, nondistended. Positive bowel sounds x4. No hepatosplenomegaly.   EXTREMITIES: No clubbing, cyanosis, or edema. Pulses were 2+. His knee exam shows no significant swelling.   SKIN: There is no rashes appreciated.   NEUROLOGICAL: Cranial nerves II through XII grossly intact. Deep tendon reflexes are symmetric.   PSYCHIATRIC: Not anxious. Awake, alert, oriented x3.   LABORATORY, DIAGNOSTIC, AND RADIOLOGICAL DATA: WBC 10.9, hemoglobin 11.2, platelet count 276, glucose 77, BUN 32, creatinine 1.99, sodium 116, potassium 3.5, CO2 25, calcium 8.1. Urinalysis showed nitrites negative, leukocytes negative.   ASSESSMENT AND PLAN: The patient is an 81 year old white male who was in the hospital recently with a left knee infection and was discharged to  rehab. At that time he had a normal sodium and now his sodium is significantly low.  1. Severe hyponatremia likely in setting of poor p.o. intake as well as dehydration. He was on HCTZ which has been taken off. At this time will give him IV fluids. Follow his BMP. Check a serum and urine osmolality. Will check a TSH. If no improvement in his sodium with saline treatment, then will need Nephrology evaluation.  2. Recent history of left knee infection. Continue p.o. Levaquin. Also, had gouty crystals. Continue colchicine. Will check a uric acid level.  3. Coronary artery disease. Continue aspirin and Toprol as taking at home.  4. Hyperlipidemia. Continue Zocor as taking previously.  5. Peripheral vascular disease. Continue aspirin.  6. Gastroesophageal reflux disease. Will continue him on Nexium.  7. Glaucoma. Continue multiple eyedrops as he was taking previously.  8. Miscellaneous. Will place him on heparin for DVT prophylaxis.   TIME SPENT: 35 minutes.     ____________________________ Lacie ScottsShreyang H. Allena KatzPatel, MD shp:drc D: 08/08/2011 18:53:24 ET T: 08/09/2011 06:41:18 ET JOB#: [id number]  cc: Ryley Bachtel H. Allena KatzPatel, MD, <Dictator> Duane LopeJeffrey D. Judithann SheenSparks, MD Charise CarwinSHREYANG H Carlosdaniel Grob MD ELECTRONICALLY SIGNED 08/10/2011 16:38

## 2014-06-07 NOTE — Discharge Summary (Signed)
PATIENT NAME:  Josie SaundersRICHARD, Keir MR#:  [id number] DATE OF BIRTH:  April 04, 1920  DATE OF ADMISSION:  07/18/2011 DATE OF DISCHARGE:  07/21/2011  CHIEF COMPLAINT: Left knee pain.  HISTORY OF PRESENT ILLNESS: The patient is an 81 year old previously seen through our acute care where he had aspiration of his knee. Subsequent repeat aspiration and culture showed infection. He was placed on oral antibiotic and has been having persistent symptoms since then without any relief. He is still having severe pain and has been admitted for a septic joint.   PHYSICAL EXAMINATION: Slender white male who appears his stated age in mild distress secondary to left leg pain. He has had a great deal of difficulty moving the knee. He has range of motion of 20 to 95 degrees with significant pain with large effusion. Prior x-rays showed degenerative changes to the knee. He had been treated with oral antibiotics and repeat aspiration. With failure of this, he has been admitted for IV antibiotics. The patient did have an aspiration of the knee with irrigation of the knee with two arthroscopic cannulas placed after local anesthetic was applied. The knee was irrigated until there was clear fluid and bandage applied.   HOSPITAL COURSE: The patient was admitted on 07/18/2011. He had 1 to 2 daily aspirations a day of the knee. Each day progressively the aspiration contents became less and less purulent and became more serous and bloody. The patient's vital signs remained stable through his stay and blood work remained normal. The patient now is tolerating p.o. and did okay with physical therapy. On 07/21/2011, the patient was ready for discharge but was not ready to go home. The patient stated that he would like to go to rehab where he could have someone help take care of him and states he did not have many people at home to help him. On 07/21/2011 the patient's knee appears less swollen and the patient does have some moderate pain. The  patient's vital signs and lab work remain stable. The patient was ready for discharge to rehab on 07/21/2011.   CONDITION AT DISCHARGE: Stable.   DISCHARGE INSTRUCTIONS: The patient is to be evaluated and treated for difficulty walking, left weight-bearing as tolerated. He is to resume a regular diet. The patient is to start Cipro 500 mg twice a day x10 days. [facility] repeat examination of the knee. The patient is to keep the leg elevated.   DISCHARGE MEDICATIONS:  1. Acetaminophen/oxycodone 325/5 mg tablet 1 to 2 tablets oral every 4 hours p.r.n. for pain. 2. Timolol 0.5% ophthalmic drops one drop to both eyes twice a day. 3. Sodium polystyrene sulfate 15 grams, 60 mL suspension, 15 grams oral daily. 4. Ondansetron 4 mg p.o. every 4 hours p.r.n. for nausea. 5. Milk of Magnesia 30 mg oral at bedtime p.r.n. for constipation.  6. Vitamin E capsule 400 units oral daily. 7. Tamsulosin capsule 0.4 mg oral daily after a meal. 8. Amlodipine tablet 10 mg oral daily. 9. Clopidogrel tablet 75 mg oral daily. 10. Isosorbide mononitrate SA tablet 60 mg oral daily.  11. Simvastatin tablet 10 mg oral at bedtime. 12. Aspirin 81 mg oral daily. 13. Nexium 20 mg oral at 6 a.m. 14. Betaxolol ophthalmic 0.25% suspension one drop to both eyes daily. 15. Brimonidine 0.15% ophthalmic solution one drop to both eyes daily. 16. Hydrochlorothiazide tablet 25 mg oral daily. 17. Metoprolol XL 200 mg oral daily. 18. Centrum Silver 1 tablet  oral daily.  19. Dorzolamide HCL 2% ophthalmic drops two drops to both eyes twice a day. 20. Bisacodyl suppository 10 mg rectal daily p.r.n. for constipation.  21. Ciprofloxacin tablet 500 mg oral every 12 hours x10 days. 22. Lovenox injection 30 mg subcutaneous daily x7 days.  ____________________________ Evon Slack, PA-C tcg:slb D: 07/21/2011 13:02:29 ET T: 07/21/2011 14:36:58 ET JOB#: [id number]  cc: Evon Slack, PA-C, <Dictator> Evon Slack PA ELECTRONICALLY SIGNED 07/24/2011 12:59

## 2014-06-07 NOTE — H&P (Signed)
PATIENT NAME:  [name] [name], [name] [name] MR#:  [id number] DATE OF BIRTH:  September 29, 1920  DATE OF ADMISSION:  07/18/2011  CHIEF COMPLAINT: Left knee pain.   HISTORY OF PRESENT ILLNESS: The patient is an [age] year old previously seen through our acute care where he had aspiration of his knee. Subsequent repeat aspiration and culture showed infection. He was placed on oral antibiotics and has been having persistent symptoms since then without relief. He is still having severe pain and is being admitted for treatment of septic joint.   PAST MEDICAL HISTORY:  1. Chronic kidney disease. 2. Peripheral vascular disease. 3. Cardiac stents.   PAST SURGICAL HISTORY:  1. Carotid artery stents in legs. 2. Cardiac stents. 3. Kidney stents. 4. Cataract removal.   ALLERGIES: No known drug allergies.   MEDICATIONS:  1. Aspirin 81 mg daily.  2. Flomax 0.4 mg daily.  3. Isosorbide mononitrate extended-release 60 mg daily.  4. Kayexalate 8 teaspoons every other day. 5. Nexium 20 mg daily.  6. Metoprolol succinate extended-release 200 mg daily.  7. Simvastatin 10 mg daily.  8. Plavix 75 mg daily.  9. Amlodipine 10 mg daily.  10. Hydrochlorothiazide 25 mg daily.   SOCIAL HISTORY: He is married. Nonsmoker, nondrinker.   FAMILY HISTORY: Positive for cardiovascular disease.   PHYSICAL EXAMINATION: Slender white male who appears his stated age in mild distress secondary to left leg pain. He has a great deal of difficulty moving the knee. He has range of motion of 20 to 95 degrees with significant pain with large effusion.   Prior x-rays showed degenerative changes to the knee.   He had been treated with oral antibiotic and repeat aspiration. With failure of this he is being admitted for IV antibiotics. In the office he did have an aspiration of the knee with irrigation of the knee with two arthroscopic cannulas placed after local anesthetic was applied. The knee was irrigated until there was clear fluid and bandage  applied. Will consult Dr. Judithann SheenSparks as well.   ____________________________ Leitha SchullerMichael J. Aayana Reinertsen, MD mjm:drc D: 07/19/2011 08:13:49 ET T: 07/19/2011 08:39:54 ET JOB#: [id number]  cc: Leitha SchullerMichael J. Lanyah Spengler, MD, <Dictator> Leitha SchullerMICHAEL J Keland Peyton MD ELECTRONICALLY SIGNED 07/19/2011 12:43

## 2014-06-07 NOTE — Op Note (Signed)
PATIENT NAME:  [name] [name], [name] [name] MR#:  [id number] DATE OF BIRTH:  1920-12-13  DATE OF PROCEDURE:  08/10/2011  PREOPERATIVE DIAGNOSES:  1. Left knee effusion, possible septic joint.  2. Possible gout.  POSTOPERATIVE DIAGNOSES:  1. Left knee effusion, possible septic joint.  2. Possible gout.  PROCEDURE: Left knee aspiration.   ANESTHESIA: Local.   SURGEON: Leitha SchullerMichael J. Jourdin Gens, MD   DESCRIPTION OF PROCEDURE: After informed consent was obtained, 5 mL of 1% Xylocaine was infiltrated into the superolateral aspect of the knee after prepping with alcohol. After allowing this to set, the skin was prepped with chlorhexidine and under sterile technique an 18-gauge needle was inserted. Approximately 50 mL of cloudy fluid was withdrawn and sent to the lab as specimen for culture, cell count with differential as well as synovial fluid analysis including crystalline analysis. A Band-Aid was applied. The patient tolerated the procedure well.   ESTIMATED BLOOD LOSS: Minimal.   COMPLICATIONS: None.   SPECIMEN: None.    ____________________________ Leitha SchullerMichael J. Priyana Mccarey, MD mjm:drc D: 08/10/2011 22:21:46 ET T: 08/11/2011 09:44:47 ET JOB#: [id number]  cc: Leitha SchullerMichael J. Eri Mcevers, MD, <Dictator> Leitha SchullerMICHAEL J Livingston Denner MD ELECTRONICALLY SIGNED 08/11/2011 12:48

## 2014-06-07 NOTE — Consult Note (Signed)
PATIENT NAME:  [name] [name], [name] [name] MR#:  [id number] DATE OF BIRTH:  October 20, 1920  DATE OF CONSULTATION:  07/18/2011  REFERRING PHYSICIAN:  Kennedy BuckerMichael Menz, MD  CONSULTING PHYSICIAN:  Duane LopeJeffrey D. Judithann SheenSparks, MD  REASON FOR CONSULTATION: Medical management in a patient with a septic left knee.   HISTORY OF PRESENT ILLNESS: The patient is an 81 year old male admitted by Orthopedics earlier today with a septic left knee. Wound cultures have grown out Staph aureus which is not Methicillin-resistant. He is currently on IV clindamycin. The patient has a history of coronary artery disease status post PTCA with stent placement, peripheral vascular disease status post right carotid endarterectomy, as well as stage IV chronic kidney disease with hyperkalemia and chronic anemia. Currently, the patient is without complaints except for knee pain. Denies any fevers, chills, nausea, vomiting, chest pain, shortness of breath, palpitations, abdominal pain, dysuria, hematuria, or diarrhea.   PAST MEDICAL HISTORY: 1. Atherosclerotic cardiovascular disease status post percutaneous transluminal coronary angioplasty with stent placement.  2. Stage IV chronic kidney disease.  3. Chronic hyperkalemia. 4. Benign hypertension.  5. Peripheral vascular disease, status post right carotid endarterectomy.  6. Hyperlipidemia.  7. Status post bilateral inguinal hernia repairs.  8. Benign prostatic hypertrophy.  9. Diverticulosis.  10. History of colonic polyps. 11. History of Peyronie's disease.  12. History of recurrent esophagitis.  13. Hyperuricemia.  14. History of liver apices.  15. History of pneumonia.   MEDICATIONS ON ADMISSION:  1. Aspirin 81 mg p.o. daily.  2. Flomax 0.4 mg p.o. daily.  3. Imdur 60 mg p.o. daily.  4. Kayexalate 15 grams p.o. daily.  5. Nexium 20 mg p.o. daily.  6. Toprol-XL 200 mg p.o. daily.  7. Zocor 10 mg p.o. q.p.m.  8. Plavix 75 mg p.o. daily.  9. Norvasc 10 mg p.o. daily.   10. Hydrochlorothiazide 25 mg p.o. daily.   ALLERGIES: Flagyl.   SOCIAL HISTORY: The patient denies history of alcohol or tobacco abuse.   FAMILY HISTORY: Positive for coronary artery disease but otherwise unremarkable.   REVIEW OF SYSTEMS: As per history of present illness.   PHYSICAL EXAMINATION:   GENERAL: The patient is in no acute distress.   VITAL SIGNS: Vital signs are currently remarkable for a blood pressure of 165/73 with a heart rate of 91 and a respiratory rate of 18. He is afebrile.   HEENT: Normocephalic, atraumatic. Pupils equally round and reactive to light and accommodation. Extraocular movements are intact. Sclerae are nonicteric. Conjunctivae are clear. Oropharynx is clear.   NECK: Supple without JVD. No adenopathy or thyromegaly is noted.   LUNGS: Clear to auscultation and percussion without wheezes, rales, or rhonchi. No dullness.   CARDIAC: Regular rate and rhythm with normal S1 and S2. There is a 1/6 systolic murmur noted. No rubs or gallops are present.   ABDOMEN: Soft, nontender with normoactive bowel sounds. No organomegaly or masses were appreciated. No hernias or bruits were noted.   EXTREMITIES: Without clubbing, cyanosis, or edema. Pulses were 2+ bilaterally.   SKIN: Warm and dry without rash or lesions.   NEUROLOGIC: Cranial nerves II through XII grossly intact. Deep tendon reflexes were symmetric. Motor and sensory exams nonfocal.   PSYCH: Alert and oriented to person, place, and time. He was cooperative and used good judgment.    ASSESSMENT:  1. Septic left knee with Staph aureus.  2. Stage IV chronic kidney disease.  3. Chronic anemia.  4. Chronic hyperkalemia.  5. Atherosclerotic cardiovascular disease status post PTCA with stent  placement.  6. Benign hypertension.  7. Hyperlipidemia.  8. Peripheral vascular disease, status post right carotid endarterectomy.   PLAN:  1. Agree with current care and continuation of his outpatient  regimen.  2. Will send off blood cultures and continue IV antibiotics for now.  3. Will add Lovenox for DVT and PE prophylaxis.  4. Follow-up routine labs in the morning.  5. Will continue to follow this patient with you while in the hospital.   Thank you for the consultation. Please call if questions arise.   ____________________________ Duane Lope. Judithann Sheen, MD jds:drc D: 07/18/2011 18:47:56 ET T: 07/19/2011 08:59:22 ET JOB#: [id number]  cc: Duane Lope. Judithann Sheen, MD, <Dictator> Errika Narvaiz Rodena Medin MD ELECTRONICALLY SIGNED 07/19/2011 12:09

## 2014-06-07 NOTE — Discharge Summary (Signed)
PATIENT NAME:  [name], [name] MR#:  [id number] DATE OF BIRTH:  January 31, 1921  DATE OF ADMISSION:  08/08/2011 DATE OF DISCHARGE:  08/15/2011  TYPE OF DISCHARGE: The patient is transferred to a skilled nursing facility.   REASON FOR ADMISSION: Severe hyponatremia.   HISTORY OF PRESENT ILLNESS: The patient is an 81 year old male with a history of stage IV chronic kidney disease as well as benign hypertension and recent left knee infection who presented to the Emergency Room with confusion and poor p.o. intake. In the Emergency Room, the patient was found to have a sodium of 116 with a low potassium as well. He was admitted for further evaluation.   PAST MEDICAL HISTORY:  1. Atherosclerotic cardiovascular disease status post percutaneous transluminal coronary angioplasty with stent placement times two.  2. Stage IV chronic kidney disease.  3. Chronic hypokalemia.  4. Benign hypertension.  5. Peripheral vascular disease.  6. Hyperlipidemia.  7. Benign prostatic hypertrophy.  8. Diverticulosis.  9. Gout.  10. History of left knee septic arthritis.   MEDICATIONS ON ADMISSION: Please see admission note.   ALLERGIES: Flagyl.   SOCIAL HISTORY: Negative for alcohol or tobacco abuse.   FAMILY HISTORY: Positive for coronary artery disease.   REVIEW OF SYSTEMS: As per admission note.   PHYSICAL EXAM: The patient was weak, in no acute distress. Vital signs were stable and he was afebrile. HEENT exam was unremarkable. Neck was supple without JVD. Lungs were clear. Cardiac exam revealed a regular rate and rhythm with normal S1 and S2. Abdomen was soft and nontender. Extremities were without edema. Neurologic exam was grossly nonfocal.   HOSPITAL COURSE: The patient was admitted with severe hyponatremia with poor p.o. intake and dehydration. He was taken off hydrochlorothiazide and given IV fluids. He was seen in consultation by infectious disease because of his left knee septic arthritis.  He was also  seen by orthopedics.  Continued p.o. antibiotics were recommended. However, because of the hyponatremia his antibiotics were adjusted to Keflex. The patient's renal function and sodium improved with hydration. His p.o. intake improved. He was seen in consultation by physical therapy and remained weak. He required a walker for ambulation. Continued skilled nursing placement was recommended by physical therapy. The patient remained stable with resolution of his confusion. He did remain weak. He is now transferred to the skilled nursing facility for further care and rehabilitation.   DISCHARGE DIAGNOSES:  1. Hyponatremia.  2. Hypokalemia.  3. Dehydration, resolved.  4. Altered mental status/encephalopathy, metabolic, resolved.  5. Left knee septic arthritis.  6. Atherosclerotic cardiovascular disease.  7. Hyperlipidemia.  8. Peripheral vascular disease.  9. Glaucoma.  10. Gastroesophageal reflux disease.  11. Benign prostatic hypertrophy.   DISCHARGE MEDICATIONS:  1. Ultram 50 mg p.o. every six hours p.r.n. pain.  2. Aspirin 81 mg p.o. daily.  3. Norvasc 10 mg p.o. daily.  4. Plavix 75 mg p.o. daily.  5. Betaxolol eyedrops, one drop in each eye b.i.d.  6. Brimonidine eyedrops one drop in each eye b.i.d.  7. Colcrys 0.6 mg p.o. daily.  8. Dorzolamide eyedrops, one drop in each eye b.i.d.  9. Imdur 60 mg p.o. daily.  10. Toprol-XL 100 mg p.o. daily.  11. Zocor 10 mg p.o. at bedtime.  12. Flomax 0.4 mg p.o. at bedtime.  13. Vitamin E 400 units p.o. daily.  14. Protonix 40 mg p.o. daily.  15. Keflex 500 mg p.o. q. 8 hours times one week.  16. Hydralazine 25 mg p.o. t.i.d.  17.  Nystatin powder to affected area t.i.d.  18. K-Dur 10 mEq p.o. daily.   FOLLOW-UP PLANS AND APPOINTMENTS: The patient was discharged on a mechanical soft diet with a 1000-mL fluid restriction per day. He will be seen in consultation by physical therapy for ambulation. We will obtain a CBC, MET-B, and a magnesium  level in one week. He will be followed by the resident physician at the skilled nursing facility.    ____________________________ Leonie Douglas. Doy Hutching, MD jds:bjt D: 08/15/2011 07:35:58 ET T: 08/15/2011 08:19:43 ET JOB#: [id number]  cc: Leonie Douglas. Doy Hutching, MD, <Dictator> JEFFREY Lennice Sites MD ELECTRONICALLY SIGNED 08/15/2011 9:10

## 2014-06-07 NOTE — Consult Note (Signed)
Impression: 81yo WM w/ h/o CAD, PVD, CRI, recent Methacillin Sensitive Staph aureus septic left knee admitted with hyponatremia likely from medications with persistent swelling of the left knee.  He underwent several aspirations of the knee during his last hospitalization.  He was given IV therapy for about a week before being switched to levofloxacin orally which he continues to take.  Overall, his knee is not bothering him much.  He has some discomfort with ambulation and some effusion on exam, but his ROM is good and he does not have erythema or tenderness. Typically, septic joints either need to have open irrigation or multiple aspirations until there is only minimal inflammation found.  At his last aspiration on 6/7, he has 3000+ WBC, but this was markedly down from his prior aspirations.  I will typically give two weeks of IV therapy followed by two weeks of oral following the last aspiration or I&D.  He received only one week, but has clinically done well.  The drug of choice for Methacillin Sensitive Staph aureus is keflex not levofloxacin.  Fluoroquinolones have very good bioavailability and penetration into bone, but may allow for easier development of resistance.  There is ample evidence for fluoroquinolones to be effective however. Given that he has clinically improved, I would favor continuing the levofloxacin.  He still has an effusion.  Ortho is to see him.  If they felt that there was need for another aspiration and cultures were positive, I would consider changing to cefazolin for two weeks followed by keflex.  Otherwise, I would continue levofloxacin for 4 weeks from his last aspiration (through 08/18/11).  Electronic Signatures: Muzamil Harker, Rosalyn GessMichael E (MD)  (Signed on 27-Jun-13 09:34)  Authored  Last Updated: 27-Jun-13 09:34 by Masae Lukacs, Rosalyn GessMichael E (MD)

## 2014-06-07 NOTE — Consult Note (Signed)
PATIENT NAME:  [name] SaundersRICHARD, Berkley MR#:  [id number] DATE OF BIRTH:  Feb 27, 1920  DATE OF CONSULTATION:  07/19/2011  REFERRING PHYSICIAN:   CONSULTING PHYSICIAN:  Linus Galasodd Damyah Gugel, DPM  REASON FOR CONSULTATION: This is an 81 year old male recently admitted for a septic left knee. Patient has a history of some ulceration on his toes and is seen for evaluation.   PAST MEDICAL HISTORY:  1. Atherosclerotic cardiovascular disease status post percutaneous transluminal coronary angioplasty with stent placement.  2. Stage IV chronic kidney disease.  3. Chronic hyperkalemia.  4. Benign hypertension.  5. Peripheral vascular disease status post right carotid endarterectomy.  6. Hyperlipidemia.  7. Status post bilateral inguinal hernia repairs.  8. Benign prosthetic hypertrophy.  9. Diverticulosis.  10. History of colonic polyps.  11. History of Peyronie's disease.  12. History of recurrent esophagitis.  13. Hyperuricemia.  14. History of liver apices.  15. History of pneumonia.   MEDICATIONS ON ADMISSION:  1. Aspirin 81 mg p.o. daily. 2. Flomax 0.4 mg p.o. daily.  3. Imdur 60 mg p.o. daily.  4. Kayexalate 15 grams p.o. daily.  5. Nexium 20 mg p.o. daily. 6. Toprol-XL 200 mg p.o. daily. 7. Zocor 10 mg p.o. q.p.m.  8. Plavix 75 mg p.o. daily. 9. Norvasc 10 mg p.o. daily. 10. Hydrochlorothiazide 25 mg p.o. daily.   ALLERGIES: Flagyl.   SOCIAL HISTORY: No alcohol or tobacco use.   FAMILY HISTORY: Coronary artery disease.   REVIEW OF SYSTEMS: Some swelling and pain in his left knee. No numbness or paresthesias in the feet. Denies any shortness of breath or wheezing. No stomach pain or heartburn. No dysuria.   PHYSICAL EXAMINATION:  VASCULAR: DP and PT pulse are palpable. Capillary filling time intact.   NEUROLOGICAL: Epicritic sensations are grossly intact.   INTEGUMENT: Skin is warm, dry, and mildly atrophic. Diminished hair growth. There is a scabbed over ulceration beneath the right hallux  with no drainage, approximately 1 cm diameter. Healed over ulceration at the distal tip the left second toe. No active cellulitis.   MUSCULOSKELETAL: Some digital contractures. Stiff range of motion in the pedal joints. Limited motion in the left due to the septic knee.   ASSESSMENT: Ulceration, stable, bilateral.   PLAN: At this point they are stable and may leave bandaged. We did debride some of his thick toenails. At this point he will follow up outpatient.   ____________________________ Linus Galasodd Rylei Masella, DPM tc:cms D: 07/19/2011 13:00:29 ET T: 07/19/2011 13:15:21 ET JOB#: [id number]  cc: Linus Galasodd Wyatte Dames, DPM, <Dictator> Duane LopeJeffrey D. Judithann SheenSparks, MD Santresa Levett DPM ELECTRONICALLY SIGNED 07/28/2011 9:28

## 2014-06-07 NOTE — Consult Note (Signed)
PATIENT NAME:  [name] SaundersRICHARD, Jaelynn MR#:  [id number] DATE OF BIRTH:  18-Nov-1920  DATE OF CONSULTATION:  08/10/2011  REFERRING PHYSICIAN:  Dr. Einar CrowMarshall Anderson  CONSULTING PHYSICIAN:  Rosalyn GessMichael E. Helia Haese, MD  REASON FOR CONSULTATION: Septic left knee.   HISTORY OF PRESENT ILLNESS: Patient is an 81 year old white man with a past history significant for coronary artery disease, peripheral vascular disease, chronic renal insufficiency, and recent left knee septic arthritis with methicillin sensitive Staphylococcus aureus who was admitted on 08/08/2011 with hyponatremia. Patient had apparently not been eating and drinking since his discharge from the hospital recently. He had been discharged on levofloxacin and hydrochlorothiazide. He was noted to have hyponatremia which was checked due to some confusion. The hydrochlorothiazide was stopped. His sodium, however, continued to be low, down to 116, and he was admitted to the hospital. During his last hospitalization earlier this month he was found to have septic arthritis of the left knee. He was admitted on 06/04 with left knee pain. He had an aspiration which demonstrated infection. He was given oral antibiotics initially and the pain continued to worsen so he was admitted to the hospital. He underwent several aspirations of the knee which showed significant white cells in the joint space. He was initially treated with IV therapy for approximately one week. His most recent aspiration on 06/07 showed 3464 white cells 97% of which were neutrophils which was down from 54,000 on 05/29. Cultures persistently grew methicillin sensitive Staphylococcus aureus. Blood cultures at that time were negative. He was ultimately discharged on levofloxacin. He states that he has been doing much better. He has some discomfort with weight-bearing but he feels that the swelling has gone down. He has no redness. He has no pain without ambulation and feels like he is doing well from the point  of view of his knee. He has not had any fevers, chills, or sweats. He has had known nausea or vomiting. He has had some constipation.   ALLERGIES: Flagyl.   PAST MEDICAL HISTORY:  1. Recent methicillin sensitive Staphylococcus aureus septic left knee.  2. Chronic renal insufficiency.  3. Coronary artery disease status post stenting and angioplasty.  4. Hypertension.  5. Peripheral vascular disease status post renal stenting, right carotid endarterectomy and stent to the legs.  6. Hypercholesterolemia.  7. Status post bilateral inguinal hernia repair.  8. Benign prostatic hypertrophy.  9. Diverticulosis.  10. Gout.  11. Recurrent esophagitis.  12. 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  13. Glaucoma.   SOCIAL HISTORY: He patient is currently in a skilled nursing facility. He does not smoke nor does he drink.   FAMILY HISTORY: Positive for coronary artery disease.    REVIEW OF SYSTEMS: GENERAL: No fevers, chills, sweats, or malaise. HEENT: No headaches. No sinus congestion. No sore throat. NECK: No stiffness. No swollen glands. RESPIRATORY: No cough. No shortness of breath. No sputum production. CARDIAC: No chest pains or palpitations. GASTROINTESTINAL: No nausea, no vomiting, no abdominal pain. He has had some constipation recently. GENITOURINARY: No complaints. MUSCULOSKELETAL: He has had some discomfort with ambulating on his left knee but this is markedly improved from his prior hospitalization. He has no redness or swelling in the left knee. He has had no drainage. SKIN: No rashes. NEUROLOGIC: No focal weakness. He has had some confusion per the history and physical, however.   PHYSICAL EXAMINATION:  VITAL SIGNS: T-max 98.9, T-current 98.1, pulse 76, blood pressure 160/70, 94% on room air.   GENERAL: 81 year old white man in no  acute distress.   HEENT: Normocephalic, atraumatic.   CHEST: Clear to auscultation bilaterally. Good air movement. No focal consolidation.   CARDIAC: Regular  rate and rhythm without murmur, rub, or gallop.   ABDOMEN: Soft, nontender, nondistended. No hepatosplenomegaly. No hernia is noted.   EXTREMITIES: His left knee has effusion present. He was able to move the knee without any significant discomfort. There is no surrounding erythema and the knee was not particularly warm to touch. There is no drainage noted from the knee. All other joints are without evidence for tenosynovitis.   SKIN: There were no rashes present.   NEUROLOGIC: The patient was awake and interactive, moving all four extremities.   PSYCHIATRIC: Mood and affect appeared normal.   LABORATORY, DIAGNOSTIC, AND RADIOLOGICAL DATA: BUN 23, creatinine 1.59, sodium 119, potassium 3.6, bicarbonate 16, anion gap 11. White count 8.3 yesterday with hemoglobin 10.0, platelet count 204, ANC 6.6. White count on admission was 10.9. His last white count during his last hospitalization on 06/07 was 7.5 with an ANC of 5.4. Cultures from his lost hospitalization of the knee grew methicillin sensitive Staphylococcus aureus. Blood cultures at that time were negative. There is no radiographic data for review.   IMPRESSION: 81 year old white man with a history of coronary artery disease, peripheral vascular disease, and chronic renal insufficiency, and recent methicillin sensitive Staphylococcus aureus septic left knee admitted with hyponatremia, likely from medications with persistent swelling of the left knee.   RECOMMENDATIONS:  1. He underwent several aspirations of the knee during his last hospitalization. He was given IV therapy for about a week before being switched to levofloxacin orally which he continues to take. Overall his knee is not bothering him much. He has some discomfort with ambulation and some effusion on exam but his range of motion is good and he does not have erythema or tenderness.  2. Typically septic joints either need to have open irrigation or multiple aspirations until there is  only minimal inflammation found. At his last aspiration on 06/07 he had 3000+ white cells but this was markedly down from his prior aspirations. I will typically give two weeks of IV therapy followed by two weeks of oral following the last aspiration or incision and drainage. He received only one week but has clinically done well. The drug of choice for methicillin sensitive Staphylococcus aureus is Keflex, not levofloxacin. Fluoroquinolones have good bioavailability and penetrate into bone but may allow for easier development of resistance. There is ample evidence for fluoroquinolones to be effective in joint infection, however. Given that he has clinically improved I would favor continuing the levofloxacin. He still has an effusion. Orthopedics is to see him. If they felt that there was need for another aspiration and the cultures are positive I would consider changing him to cefazolin for two weeks followed by Keflex. Otherwise, I would continue the levofloxacin for four weeks from his last aspiration (through 08/18/2011).   This is a low-level infectious disease consult. Thank you very much for involving me in Mr. Nilsson's care.   ____________________________ Rosalyn Gess. [place] Baldwin, MD meb:cms D: 08/10/2011 09:46:02 ET T: 08/10/2011 10:34:57 ET JOB#: [id number]  cc: Rosalyn Gess. Sejal Cofield, MD, <Dictator>  Jabarri Stefanelli E Lyvia Mondesir MD ELECTRONICALLY SIGNED 08/11/2011 15:09
